# Patient Record
Sex: Female | Born: 1945 | Race: White | Hispanic: No | Smoking: Never smoker
Health system: Southern US, Community
[De-identification: ages and names within clinical notes are randomized; demographics above are authoritative.]

## PROBLEM LIST (undated history)

## (undated) DIAGNOSIS — K589 Irritable bowel syndrome without diarrhea: Secondary | ICD-10-CM

## (undated) DIAGNOSIS — R001 Bradycardia, unspecified: Secondary | ICD-10-CM

## (undated) DIAGNOSIS — I493 Ventricular premature depolarization: Secondary | ICD-10-CM

## (undated) DIAGNOSIS — L900 Lichen sclerosus et atrophicus: Secondary | ICD-10-CM

## (undated) DIAGNOSIS — K449 Diaphragmatic hernia without obstruction or gangrene: Secondary | ICD-10-CM

## (undated) DIAGNOSIS — D649 Anemia, unspecified: Secondary | ICD-10-CM

## (undated) DIAGNOSIS — R51 Headache: Secondary | ICD-10-CM

## (undated) DIAGNOSIS — C50919 Malignant neoplasm of unspecified site of unspecified female breast: Secondary | ICD-10-CM

## (undated) DIAGNOSIS — K859 Acute pancreatitis without necrosis or infection, unspecified: Secondary | ICD-10-CM

## (undated) DIAGNOSIS — F419 Anxiety disorder, unspecified: Secondary | ICD-10-CM

## (undated) DIAGNOSIS — Z5189 Encounter for other specified aftercare: Secondary | ICD-10-CM

## (undated) DIAGNOSIS — I10 Essential (primary) hypertension: Secondary | ICD-10-CM

## (undated) DIAGNOSIS — K219 Gastro-esophageal reflux disease without esophagitis: Secondary | ICD-10-CM

## (undated) DIAGNOSIS — Z973 Presence of spectacles and contact lenses: Secondary | ICD-10-CM

## (undated) DIAGNOSIS — K6289 Other specified diseases of anus and rectum: Secondary | ICD-10-CM

## (undated) DIAGNOSIS — N809 Endometriosis, unspecified: Secondary | ICD-10-CM

## (undated) DIAGNOSIS — K602 Anal fissure, unspecified: Secondary | ICD-10-CM

## (undated) DIAGNOSIS — H269 Unspecified cataract: Secondary | ICD-10-CM

## (undated) DIAGNOSIS — F329 Major depressive disorder, single episode, unspecified: Secondary | ICD-10-CM

## (undated) DIAGNOSIS — R0602 Shortness of breath: Secondary | ICD-10-CM

## (undated) DIAGNOSIS — G8929 Other chronic pain: Secondary | ICD-10-CM

## (undated) DIAGNOSIS — F410 Panic disorder [episodic paroxysmal anxiety] without agoraphobia: Secondary | ICD-10-CM

## (undated) DIAGNOSIS — R519 Headache, unspecified: Secondary | ICD-10-CM

## (undated) DIAGNOSIS — K279 Peptic ulcer, site unspecified, unspecified as acute or chronic, without hemorrhage or perforation: Secondary | ICD-10-CM

## (undated) DIAGNOSIS — F32A Depression, unspecified: Secondary | ICD-10-CM

## (undated) HISTORY — DX: Anxiety disorder, unspecified: F41.9

## (undated) HISTORY — PX: CATARACT EXTRACTION, BILATERAL: SHX1313

## (undated) HISTORY — PX: ABDOMINAL SURGERY: SHX537

## (undated) HISTORY — DX: Diaphragmatic hernia without obstruction or gangrene: K44.9

## (undated) HISTORY — DX: Lichen sclerosus et atrophicus: L90.0

## (undated) HISTORY — DX: Other chronic pain: G89.29

## (undated) HISTORY — DX: Headache, unspecified: R51.9

## (undated) HISTORY — DX: Unspecified cataract: H26.9

## (undated) HISTORY — DX: Anemia, unspecified: D64.9

## (undated) HISTORY — DX: Endometriosis, unspecified: N80.9

## (undated) HISTORY — DX: Encounter for other specified aftercare: Z51.89

## (undated) HISTORY — DX: Ventricular premature depolarization: I49.3

## (undated) HISTORY — PX: TONSILLECTOMY: SUR1361

## (undated) HISTORY — DX: Anal fissure, unspecified: K60.2

## (undated) HISTORY — DX: Headache: R51

## (undated) HISTORY — DX: Malignant neoplasm of unspecified site of unspecified female breast: C50.919

## (undated) HISTORY — DX: Peptic ulcer, site unspecified, unspecified as acute or chronic, without hemorrhage or perforation: K27.9

## (undated) HISTORY — PX: LIPOMA EXCISION: SHX5283

## (undated) HISTORY — DX: Shortness of breath: R06.02

## (undated) HISTORY — DX: Acute pancreatitis without necrosis or infection, unspecified: K85.90

---

## 1990-10-19 HISTORY — PX: TOTAL ABDOMINAL HYSTERECTOMY W/ BILATERAL SALPINGOOPHORECTOMY: SHX83

## 2000-10-19 DIAGNOSIS — Z5189 Encounter for other specified aftercare: Secondary | ICD-10-CM

## 2000-10-19 HISTORY — DX: Encounter for other specified aftercare: Z51.89

## 2000-10-19 HISTORY — PX: LAPAROSCOPIC NISSEN FUNDOPLICATION: SHX1932

## 2000-12-03 ENCOUNTER — Ambulatory Visit (HOSPITAL_COMMUNITY): Admission: RE | Admit: 2000-12-03 | Discharge: 2000-12-03 | Payer: Self-pay | Admitting: Surgery

## 2000-12-03 ENCOUNTER — Encounter: Payer: Self-pay | Admitting: Surgery

## 2001-08-03 ENCOUNTER — Other Ambulatory Visit: Admission: RE | Admit: 2001-08-03 | Discharge: 2001-08-03 | Payer: Self-pay | Admitting: Internal Medicine

## 2005-06-30 ENCOUNTER — Ambulatory Visit: Payer: Self-pay | Admitting: Internal Medicine

## 2005-07-01 ENCOUNTER — Ambulatory Visit: Payer: Self-pay | Admitting: Internal Medicine

## 2011-12-28 ENCOUNTER — Other Ambulatory Visit: Payer: Self-pay

## 2011-12-28 ENCOUNTER — Emergency Department (HOSPITAL_COMMUNITY)
Admission: EM | Admit: 2011-12-28 | Discharge: 2011-12-29 | Disposition: A | Discharge status: Home / Self Care | Payer: Self-pay | Attending: Emergency Medicine | Admitting: Emergency Medicine

## 2011-12-28 ENCOUNTER — Emergency Department (HOSPITAL_COMMUNITY): Payer: Self-pay

## 2011-12-28 ENCOUNTER — Encounter (HOSPITAL_COMMUNITY): Payer: Self-pay | Admitting: *Deleted

## 2011-12-28 DIAGNOSIS — K589 Irritable bowel syndrome without diarrhea: Secondary | ICD-10-CM | POA: Insufficient documentation

## 2011-12-28 DIAGNOSIS — R110 Nausea: Secondary | ICD-10-CM | POA: Insufficient documentation

## 2011-12-28 DIAGNOSIS — K279 Peptic ulcer, site unspecified, unspecified as acute or chronic, without hemorrhage or perforation: Secondary | ICD-10-CM | POA: Insufficient documentation

## 2011-12-28 DIAGNOSIS — R079 Chest pain, unspecified: Secondary | ICD-10-CM | POA: Insufficient documentation

## 2011-12-28 DIAGNOSIS — R109 Unspecified abdominal pain: Secondary | ICD-10-CM | POA: Insufficient documentation

## 2011-12-28 HISTORY — DX: Irritable bowel syndrome, unspecified: K58.9

## 2011-12-28 LAB — LIPASE, BLOOD: Lipase: 22 U/L (ref 11–59)

## 2011-12-28 LAB — COMPREHENSIVE METABOLIC PANEL
Albumin: 4.1 g/dL (ref 3.5–5.2)
BUN: 11 mg/dL (ref 6–23)
Calcium: 10.1 mg/dL (ref 8.4–10.5)
Creatinine, Ser: 1.08 mg/dL (ref 0.50–1.10)
GFR calc Af Amer: 62 mL/min — ABNORMAL LOW (ref >90)
Glucose, Bld: 134 mg/dL — ABNORMAL HIGH (ref 70–99)
Potassium: 3.9 mEq/L (ref 3.5–5.1)
Total Protein: 6.8 g/dL (ref 6.0–8.3)

## 2011-12-28 LAB — DIFFERENTIAL
Basophils Relative: 0 % (ref 0–1)
Eosinophils Absolute: 0.2 10*3/uL (ref 0.0–0.7)
Eosinophils Relative: 3 % (ref 0–5)
Monocytes Absolute: 0.7 10*3/uL (ref 0.1–1.0)
Monocytes Relative: 9 % (ref 3–12)
Neutrophils Relative %: 42 % — ABNORMAL LOW (ref 43–77)

## 2011-12-28 LAB — CBC
Hemoglobin: 13.1 g/dL (ref 12.0–15.0)
MCH: 31 pg (ref 26.0–34.0)
MCHC: 35 g/dL (ref 30.0–36.0)
MCV: 88.6 fL (ref 78.0–100.0)

## 2011-12-28 MED ORDER — PANTOPRAZOLE SODIUM 40 MG IV SOLR
40.0000 mg | Freq: Once | INTRAVENOUS | Status: AC
  Administered 2011-12-28: 40 mg via INTRAVENOUS
  Filled 2011-12-28: qty 40

## 2011-12-28 MED ORDER — ONDANSETRON HCL 4 MG/2ML IJ SOLN
4.0000 mg | Freq: Once | INTRAMUSCULAR | Status: AC
  Administered 2011-12-28: 4 mg via INTRAVENOUS
  Filled 2011-12-28: qty 2

## 2011-12-28 MED ORDER — IOHEXOL 300 MG/ML  SOLN: 20.0000 mL | INTRAMUSCULAR | Status: AC

## 2011-12-28 MED ORDER — GI COCKTAIL ~~LOC~~
30.0000 mL | Freq: Once | ORAL | Status: AC
  Administered 2011-12-28: 30 mL via ORAL
  Filled 2011-12-28: qty 30

## 2011-12-28 MED ORDER — MORPHINE SULFATE 4 MG/ML IJ SOLN
4.0000 mg | Freq: Once | INTRAMUSCULAR | Status: AC
  Administered 2011-12-28: 4 mg via INTRAVENOUS
  Filled 2011-12-28: qty 1

## 2011-12-28 NOTE — ED Notes (Signed)
Care assumed at this time; resting quietly on stretcher with eyes closed - easily arousable to name call; denies nausea - currently rates RUQ abd pain 2/10 - states is "tolerable"; awaiting CT scan - pt aware of same

## 2011-12-28 NOTE — ED Provider Notes (Signed)
History     CSN: [id number]  Arrival date & time 12/28/11  1634   First MD Initiated Contact with Patient 12/28/11 2035      Chief Complaint  Patient presents with  . Chest Pain    HPI  History provided by the patient. Patient is a 66 year old female with history of IBS, PUD who presents with complaints of right-sided lower chest and abdominal pains to increase significantly earlier today around 2 PM. Patient reports having slight off-and-on pains in the same area for the past one to 2 weeks. She has also had associated decreased appetite slight nausea. Today patient reports having sudden increase of pains in the afternoon around 2 PM. Patient was at rest at that time had eaten earlier in the afternoon. Pain continues to wax and wane. At its worst pain was a 9/10. Currently pain is a 3/10. Patient has not taken any medications for her symptoms. Patient does not associate worsening of symptoms with eating. Pain does seem improved with lying flat. Pain is not pleuritic. She denies shortness of breath or hemoptysis. Patient denies any other aggravating or alleviating factors. She has no associated fever, chills, sweats, vomiting, diarrhea or constipation. Patient does report having some constipation last week. She had been taking MiraLAX and reports normal bowel movements for the past 2 days with some slight increase in gas. Patient has no history of recent long travel or prolonged sitting, surgery, history of cancer, previous DVT or PE, clotting disorder, or estrogen use.    Past Medical History  Diagnosis Date  . IBS (irritable bowel syndrome)   . Peptic ulcer disease     Past Surgical History  Procedure Date  . Fundoplasty transthoracic   . Abdominal hysterectomy     History reviewed. No pertinent family history.  History  Substance Use Topics  . Smoking status: Not on file  . Smokeless tobacco: Not on file  . Alcohol Use: No    OB History    Grav Para Term Preterm Abortions  TAB SAB Ect Mult Living                  Review of Systems  Constitutional: Positive for appetite change. Negative for fever and chills.  Respiratory: Negative for cough and shortness of breath.   Cardiovascular: Positive for chest pain.  Gastrointestinal: Positive for nausea, abdominal pain and constipation. Negative for vomiting and diarrhea.  Genitourinary: Negative for dysuria, frequency, hematuria, flank pain, vaginal bleeding and vaginal discharge.  All other systems reviewed and are negative.    Allergies  Vicodin  Home Medications   Current Outpatient Rx  Name Route Sig Dispense Refill  . DIPHENHYDRAMINE HCL 25 MG PO TABS Oral Take 25 mg by mouth every 6 (six) hours as needed.    Marland Kitchen DIPHENHYDRAMINE-APAP (SLEEP) 25-500 MG PO TABS Oral Take 1 tablet by mouth at bedtime as needed.    . TRAZODONE HCL 100 MG PO TABS Oral Take 100 mg by mouth at bedtime.      BP 138/76  Pulse 84  Temp 98.2 F (36.8 C)  SpO2 98%  Physical Exam  Nursing note and vitals reviewed. Constitutional: She is oriented to person, place, and time. She appears well-developed and well-nourished. No distress.  HENT:  Head: Normocephalic and atraumatic.  Cardiovascular: Normal rate and regular rhythm.   Pulmonary/Chest: Effort normal and breath sounds normal. No respiratory distress. She has no wheezes. She has no rales. She exhibits no tenderness.  Abdominal: Soft. She exhibits  no distension. There is tenderness in the right lower quadrant and epigastric area. There is no rigidity, no rebound, no guarding, no CVA tenderness, no tenderness at McBurney's point and negative Murphy's sign.       Surgical scars consistent with history of previous surgeries.  Musculoskeletal: She exhibits no edema and no tenderness.       No swelling or tenderness in lower legs. no signs for DVT.  Neurological: She is alert and oriented to person, place, and time.  Skin: Skin is warm and dry. No rash noted.  Psychiatric:  She has a normal mood and affect. Her behavior is normal.    ED Course  Procedures   Results for orders placed during the hospital encounter of 12/28/11  CBC      Component Value Range   WBC 7.1  4.0 - 10.5 (K/uL)   RBC 4.22  3.87 - 5.11 (MIL/uL)   Hemoglobin 13.1  12.0 - 15.0 (g/dL)   HCT 29.5  62.1 - 30.8 (%)   MCV 88.6  78.0 - 100.0 (fL)   MCH 31.0  26.0 - 34.0 (pg)   MCHC 35.0  30.0 - 36.0 (g/dL)   RDW 65.7  84.6 - 96.2 (%)   Platelets 249  150 - 400 (K/uL)  DIFFERENTIAL      Component Value Range   Neutrophils Relative 42 (*) 43 - 77 (%)   Neutro Abs 3.0  1.7 - 7.7 (K/uL)   Lymphocytes Relative 46  12 - 46 (%)   Lymphs Abs 3.2  0.7 - 4.0 (K/uL)   Monocytes Relative 9  3 - 12 (%)   Monocytes Absolute 0.7  0.1 - 1.0 (K/uL)   Eosinophils Relative 3  0 - 5 (%)   Eosinophils Absolute 0.2  0.0 - 0.7 (K/uL)   Basophils Relative 0  0 - 1 (%)   Basophils Absolute 0.0  0.0 - 0.1 (K/uL)  COMPREHENSIVE METABOLIC PANEL      Component Value Range   Sodium 139  135 - 145 (mEq/L)   Potassium 3.9  3.5 - 5.1 (mEq/L)   Chloride 102  96 - 112 (mEq/L)   CO2 25  19 - 32 (mEq/L)   Glucose, Bld 134 (*) 70 - 99 (mg/dL)   BUN 11  6 - 23 (mg/dL)   Creatinine, Ser 9.52  0.50 - 1.10 (mg/dL)   Calcium 84.1  8.4 - 10.5 (mg/dL)   Total Protein 6.8  6.0 - 8.3 (g/dL)   Albumin 4.1  3.5 - 5.2 (g/dL)   AST 18  0 - 37 (U/L)   ALT 14  0 - 35 (U/L)   Alkaline Phosphatase 46  39 - 117 (U/L)   Total Bilirubin 0.3  0.3 - 1.2 (mg/dL)   GFR calc non Af Amer 53 (*) >90 (mL/min)   GFR calc Af Amer 62 (*) >90 (mL/min)  LIPASE, BLOOD      Component Value Range   Lipase 22  11 - 59 (U/L)  TROPONIN I      Component Value Range   Troponin I <0.30  <0.30 (ng/mL)  TROPONIN I      Component Value Range   Troponin I <0.30  <0.30 (ng/mL)     Dg Chest 2 View  12/28/2011  *RADIOLOGY REPORT*  Clinical Data: Chest pain  CHEST - 2 VIEW  Comparison: None.  Findings: Cardiomediastinal silhouette is unremarkable.   No acute infiltrate or pleural effusion.  No pulmonary edema.  Bony thorax is unremarkable.  IMPRESSION: No active disease.  Original Report Authenticated By: Natasha Mead, M.D.   Ct Abdomen Pelvis W Contrast  12/29/2011  *RADIOLOGY REPORT*  Clinical Data: Right-sided pain  CT ABDOMEN AND PELVIS WITH CONTRAST  Technique:  Multidetector CT imaging of the abdomen and pelvis was performed following the standard protocol during bolus administration of intravenous contrast.  Contrast: OMNIPAQUE IOHEXOL 300 MG/ML IJ SOLN, 40mL OMNIPAQUE IOHEXOL 300 MG/ML IJ SOLN  Comparison: None.  Findings: Limited images through the lung bases demonstrate no significant appreciable abnormality. The heart size is within normal limits. No pleural or pericardial effusion.  Small hiatal hernia.  Surgical changes at the GE junction.  Normal heart size. No pleural or pericardial effusion.  Unremarkable liver, biliary system, spleen, pancreas, adrenal glands.  The duodenal diverticulum.  Too small to further characterize hypodensities within the left kidney.  No hydronephrosis or hydroureter no urinary tract calculi identified.  No bowel obstruction.  No CT evidence for colitis.  Normal appendix.  No free intraperitoneal air or fluid.  Mild scattered atherosclerotic calcification of the aorta and branch vessels.  Thin-walled bladder.  Absent uterus.  No adnexal mass.  No acute osseous abnormality identified.  IMPRESSION: No acute CT abnormality identified.  Normal appendix.  No hydronephrosis.  Original Report Authenticated By: Waneta Martins, M.D.     1. Abdominal pain       MDM  8:30 PM patient seen and evaluated. Patient no acute distress. Patient offered pain medications but she has declined at this time. She states she's feeling much better and would just like to see if there is any serious problem causing her pain.  Patient has no significant risk factors for ACS. No history of hypertension, hyperlipidemia,  diabetes, nonsmoker, family history. Patient with normal EKG and chest x-ray. Patient with no significant concerning history to be concern for PE. Patient with no shortness of breath, hypoxia, tachycardia, pleuritic pains, hemoptysis. Patient has no significant risk factors for DVT, no recent long travel or prolonged sitting, no recent surgery, no past history of cancer, no estrogen use, no clotting disorder.  10:00 PM Patient with normal lab tests today, no elevated WBC, no elevated LFTs and lipase. Patient now reporting increased right upper and epigastric pains. Patient is requesting pain medications. She has significant tenderness in the area on abdominal exam. I discussed with patient further options for evaluation. At this time we'll obtain a CT scan to rule out concerning pathology. We'll also give IV pain medications.  CT scan unremarkable for acute or emergent condition. Patient continues to feel improved. Patient discussed with attending physician. She agrees with plan and workup. At this time she's ready to return home.    Date: 12/28/2011  Rate: 80  Rhythm: normal sinus rhythm  QRS Axis: normal  Intervals: normal  ST/T Wave abnormalities: normal  Conduction Disutrbances:none  Narrative Interpretation:   Old EKG Reviewed: none available          Angus Seller, [place] 12/30/11 [phone]

## 2011-12-28 NOTE — ED Notes (Signed)
To ed for eval of right side rib pain and right arm pain since yesterday. States prior to pain starting she had 'stomach burning'. States if she lays down the pain gets better. Skin w/d, resp e/u. Nauseated.

## 2011-12-29 ENCOUNTER — Emergency Department (HOSPITAL_COMMUNITY): Payer: Self-pay

## 2011-12-29 LAB — TROPONIN I
Troponin I: <0.3 ng/mL (ref <0.30)
Troponin I: <0.3 ng/mL (ref <0.30)

## 2011-12-29 MED ORDER — IOHEXOL 300 MG/ML  SOLN
100.0000 mL | Freq: Once | INTRAMUSCULAR | Status: AC | PRN
  Administered 2011-12-29: 100 mL via INTRAVENOUS

## 2011-12-29 MED ORDER — IOHEXOL 300 MG/ML  SOLN
40.0000 mL | Freq: Once | INTRAMUSCULAR | Status: AC | PRN
  Administered 2011-12-29: 40 mL via ORAL

## 2011-12-29 MED ORDER — TRAMADOL HCL 50 MG PO TABS: 50.0000 mg | ORAL_TABLET | Freq: Four times a day (QID) | ORAL | Status: AC | PRN

## 2011-12-29 NOTE — ED Notes (Signed)
Pt states understanding of discharge instructions 

## 2011-12-29 NOTE — Discharge Instructions (Signed)
You were seen and evaluated today for your complaints of abdominal and right chest pains. Your lab tests, EKG of your heart, chest x-ray, CAT scan of your abdomen did not show any signs for concerning or emergent cause her symptoms. At this time your providers feel you are fine to return home and followup with primary care provider. It is recommended that you have a recheck of your symptoms in the next 24-48 hours. Return to emergency room if any worsening pain, fever, chills, sweats, persistent nausea vomiting.   Abdominal Pain Abdominal pain can be caused by many things. Your caregiver decides the seriousness of your pain by an examination and possibly blood tests and X-rays. Many cases can be observed and treated at home. Most abdominal pain is not caused by a disease and will probably improve without treatment. However, in many cases, more time must pass before a clear cause of the pain can be found. Before that point, it may not be known if you need more testing, or if hospitalization or surgery is needed. HOME CARE INSTRUCTIONS   Do not take laxatives unless directed by your caregiver.   Take pain medicine only as directed by your caregiver.   Only take over-the-counter or prescription medicines for pain, discomfort, or fever as directed by your caregiver.   Try a clear liquid diet (broth, tea, or water) for as long as directed by your caregiver. Slowly move to a bland diet as tolerated.  SEEK IMMEDIATE MEDICAL CARE IF:   The pain does not go away.   You have a fever.   You keep throwing up (vomiting).   The pain is felt only in portions of the abdomen. Pain in the right side could possibly be appendicitis. In an adult, pain in the left lower portion of the abdomen could be colitis or diverticulitis.   You pass bloody or black tarry stools.  MAKE SURE YOU:   Understand these instructions.   Will watch your condition.   Will get help right away if you are not doing well or get  worse.  Document Released: 07/15/2005 Document Revised: 09/24/2011 Document Reviewed: 05/23/2008 [facility] Patient Information 2012 [place], [place].   RESOURCE GUIDE  Dental Problems  Patients with Medicaid: [facility] [phone]                                           [phone] [address] Phone:  [phone]                                                  Phone:  [phone]  If unable to pay or uninsured, contact:  [facility] or [facility] to become qualified for the adult dental clinic.  Chronic Pain Problems Contact [facility]  [phone] Patients need to be referred by their primary care doctor.  Insufficient Money for Medicine Contact [employer] Way:  call "211" or [facility] [phone].  No Primary Care Doctor Call [facility]  [phone] Other agencies that provide inexpensive medical care    [facility]  [phone]    [facility]  [phone]    [facility]  [phone]    [facility]  [phone]    [facility]  [phone]    [facility]  [phone]  Psychological Services [facility] Behavioral [facility]  [phone] [facility]  [phone] [facility]   [phone] (emergency services [phone])  Substance Abuse Resources [facility]  [phone] [facility] [phone] [place] [phone] Floydene Flock [phone] Residential & Outpatient Substance Abuse Program  [phone]  Abuse/Neglect [facility] Child Abuse Hotline (207)867-2061 [facility] Child Abuse Hotline 704-729-6591 (After Hours)  Emergency Shelter [facility] [phone]  Maternity Homes [facility] [place] [facility] [place] [phone] [facility] [phone]  MRSA Hotline #:   (737)645-2392    [facility] Resources  [facility]  [place]     United [employer]                          [facility] Dept. [address] [place]                       [address]      [address]  Blondell Reveal Phone:  [phone]                                   Phone:  [phone]                 Phone:  [phone]  [facility] Mental [facility] Phone:  [phone]  [facility] Child Abuse Hotline [phone] [phone] (After Hours)

## 2011-12-29 NOTE — ED Notes (Signed)
Kim, in CT scan, notified of pt's completion of PO contrast

## 2011-12-29 NOTE — ED Notes (Signed)
Patient transported to CT 

## 2011-12-31 NOTE — ED Provider Notes (Signed)
Medical screening examination/treatment/procedure(s) were performed by non-physician practitioner and as supervising physician I was immediately available for consultation/collaboration.  Gerhard Munch, MD 12/31/11 [phone]

## 2012-01-08 ENCOUNTER — Other Ambulatory Visit: Payer: Self-pay | Admitting: Internal Medicine

## 2012-01-11 ENCOUNTER — Ambulatory Visit
Admission: RE | Admit: 2012-01-11 | Discharge: 2012-01-11 | Disposition: A | Discharge status: Home / Self Care | Payer: PRIVATE HEALTH INSURANCE | Source: Ambulatory Visit | Attending: Internal Medicine | Admitting: Internal Medicine

## 2012-05-23 ENCOUNTER — Emergency Department (HOSPITAL_BASED_OUTPATIENT_CLINIC_OR_DEPARTMENT_OTHER)
Admission: EM | Admit: 2012-05-23 | Discharge: 2012-05-23 | Disposition: A | Discharge status: Home / Self Care | Payer: Self-pay | Attending: Emergency Medicine | Admitting: Emergency Medicine

## 2012-05-23 ENCOUNTER — Encounter (HOSPITAL_BASED_OUTPATIENT_CLINIC_OR_DEPARTMENT_OTHER): Payer: Self-pay | Admitting: *Deleted

## 2012-05-23 DIAGNOSIS — F3289 Other specified depressive episodes: Secondary | ICD-10-CM | POA: Insufficient documentation

## 2012-05-23 DIAGNOSIS — F329 Major depressive disorder, single episode, unspecified: Secondary | ICD-10-CM | POA: Insufficient documentation

## 2012-05-23 DIAGNOSIS — F419 Anxiety disorder, unspecified: Secondary | ICD-10-CM

## 2012-05-23 DIAGNOSIS — F411 Generalized anxiety disorder: Secondary | ICD-10-CM | POA: Insufficient documentation

## 2012-05-23 DIAGNOSIS — Z79899 Other long term (current) drug therapy: Secondary | ICD-10-CM | POA: Insufficient documentation

## 2012-05-23 HISTORY — DX: Depression, unspecified: F32.A

## 2012-05-23 HISTORY — DX: Major depressive disorder, single episode, unspecified: F32.9

## 2012-05-23 HISTORY — DX: Essential (primary) hypertension: I10

## 2012-05-23 HISTORY — DX: Panic disorder (episodic paroxysmal anxiety): F41.0

## 2012-05-23 MED ORDER — ALPRAZOLAM 0.5 MG PO TABS: 0.5000 mg | ORAL_TABLET | Freq: Three times a day (TID) | ORAL | Status: AC | PRN

## 2012-05-23 NOTE — ED Notes (Signed)
Patient was crying in triage, she stated she attempted suicide four years ago, she states had relatives who just moved in with her, and has stress with panic attacks.

## 2012-05-23 NOTE — ED Notes (Signed)
Patient is very emotional and crying.  States she feels like she is having panic attacks for the last 2 weeks.  Past history of depression and anxiety.  States she was on trazedom at night and has been out of for one month.  States she lives with her sister due to financial reasons and recently her niece and 3 children moved in with them into a 2 bedroom apartment.  States she feels like all the activities at home are contributing to her panic attacks.  Denies suicidal intentions, however she stated she would like to die to stop all of her emotional pain.

## 2012-05-23 NOTE — ED Provider Notes (Signed)
History     CSN: [id number]  Arrival date & time 05/23/12  1038   First MD Initiated Contact with Patient 05/23/12 1155      Chief Complaint  Patient presents with  . Panic Attack    (Consider location/radiation/quality/duration/timing/severity/associated sxs/prior treatment) HPI This generally well female presents with new concerns of increasingly frequent panic attacks.  She has a long history of depression, anxiety, chronic previous benzodiazepine use, as well as antipsychotic use.  She notes that until 2 weeks ago she was generally well.  Since that time she's had increasingly frequent, incapacitating episodes of anxiety.  She notes multiple life stress source at this time. She denies any physical complaints.  She states that her symptoms are debilitating, ease only after prolonged periods of rest.  She requests a short course of benzodiazepines She has no primary care physician. Past Medical History  Diagnosis Date  . IBS (irritable bowel syndrome)   . Peptic ulcer disease   . Hypertension   . Panic attacks   . Depression     Past Surgical History  Procedure Date  . Fundoplasty transthoracic   . Abdominal hysterectomy   . Tonsillectomy     No family history on file.  History  Substance Use Topics  . Smoking status: Never Smoker   . Smokeless tobacco: Not on file  . Alcohol Use: No    OB History    Grav Para Term Preterm Abortions TAB SAB Ect Mult Living                  Review of Systems  Constitutional:       HPI  HENT:       HPI otherwise negative  Eyes: Negative.   Respiratory:       HPI, otherwise negative  Cardiovascular:       HPI, otherwise nmegative  Gastrointestinal: Negative for vomiting.  Genitourinary:       HPI, otherwise negative  Musculoskeletal:       HPI, otherwise negative  Skin: Negative.   Neurological: Negative for syncope.  Psychiatric/Behavioral: Positive for disturbed wake/sleep cycle. Negative for suicidal ideas,  hallucinations, confusion, self-injury and dysphoric mood. The patient is nervous/anxious. The patient is not hyperactive.     Allergies  Vicodin  Home Medications   Current Outpatient Rx  Name Route Sig Dispense Refill  . ALPRAZOLAM 0.5 MG PO TABS Oral Take 1 tablet (0.5 mg total) by mouth 3 (three) times daily as needed for anxiety. 30 tablet 0  . DIPHENHYDRAMINE HCL 25 MG PO TABS Oral Take 25 mg by mouth every 6 (six) hours as needed.    Marland Kitchen DIPHENHYDRAMINE-APAP (SLEEP) 25-500 MG PO TABS Oral Take 1 tablet by mouth at bedtime as needed.      BP 141/89  Pulse 78  Temp 98.8 F (37.1 C) (Oral)  Resp 21  Ht 5\' 4"  (1.626 m)  Wt 135 lb (61.236 kg)  BMI 23.17 kg/m2  SpO2 100%  Physical Exam  Nursing note and vitals reviewed. Constitutional: She is oriented to person, place, and time. She appears well-developed and well-nourished. No distress.  HENT:  Head: Normocephalic and atraumatic.  Eyes: Conjunctivae and EOM are normal.  Cardiovascular: Normal rate and regular rhythm.   Pulmonary/Chest: Effort normal and breath sounds normal. No stridor. No respiratory distress.  Abdominal: She exhibits no distension.  Musculoskeletal: She exhibits no edema.  Neurological: She is alert and oriented to person, place, and time. No cranial nerve deficit.  Skin: Skin  is warm and dry.  Psychiatric: Her speech is normal and behavior is normal. Judgment normal. Her mood appears anxious. Thought content is not paranoid and not delusional. Cognition and memory are normal. She expresses no homicidal and no suicidal ideation. She expresses no suicidal plans and no homicidal plans.    ED Course  Procedures (including critical care time)  Labs Reviewed - No data to display No results found.   1. Anxiety       MDM  This well-appearing female with a long history of anxiety and depression now presents with concerns of increasingly frequent panic attack this period the patient has no physical  complaints and on exam is unremarkable.  She denies any suicidal ideation, other overtly dangerous psychiatric issues.  She was provided a short course of benzodiazepines, given referral information to a clinic, discharged in stable condition.    Gerhard Munch, MD 05/23/12 1227

## 2012-08-29 ENCOUNTER — Encounter (HOSPITAL_BASED_OUTPATIENT_CLINIC_OR_DEPARTMENT_OTHER): Payer: Self-pay | Admitting: Family Medicine

## 2012-08-29 ENCOUNTER — Emergency Department (HOSPITAL_BASED_OUTPATIENT_CLINIC_OR_DEPARTMENT_OTHER)
Admission: EM | Admit: 2012-08-29 | Discharge: 2012-08-29 | Disposition: A | Discharge status: Home / Self Care | Payer: Self-pay | Attending: Emergency Medicine | Admitting: Emergency Medicine

## 2012-08-29 DIAGNOSIS — I10 Essential (primary) hypertension: Secondary | ICD-10-CM | POA: Insufficient documentation

## 2012-08-29 DIAGNOSIS — K589 Irritable bowel syndrome without diarrhea: Secondary | ICD-10-CM | POA: Insufficient documentation

## 2012-08-29 DIAGNOSIS — F411 Generalized anxiety disorder: Secondary | ICD-10-CM | POA: Insufficient documentation

## 2012-08-29 DIAGNOSIS — F3289 Other specified depressive episodes: Secondary | ICD-10-CM | POA: Insufficient documentation

## 2012-08-29 DIAGNOSIS — F329 Major depressive disorder, single episode, unspecified: Secondary | ICD-10-CM | POA: Insufficient documentation

## 2012-08-29 DIAGNOSIS — F419 Anxiety disorder, unspecified: Secondary | ICD-10-CM

## 2012-08-29 MED ORDER — ALPRAZOLAM 0.5 MG PO TABS: 0.5000 mg | ORAL_TABLET | Freq: Every evening | ORAL | Status: DC | PRN

## 2012-08-29 NOTE — ED Notes (Signed)
Pt reports situational anxiety which had previously been helped by short-term use of Xanax. Pt has numerous stressors including financial. Denies any SI and HI. Reports support from church. Reports not sleeping well due to anxieties.

## 2012-08-29 NOTE — ED Notes (Signed)
Pt c/o anxiety attack x 2 days. Pt sts she does not take meds for anxiety. Pt sts she needs help because she has a h/o of attempting suicide 5 years ago. Pt reports increased stress recently and is having trouble coping. Pt drove self to ED.

## 2012-08-29 NOTE — ED Provider Notes (Signed)
History  This chart was scribed for Rolan Bucco, MD by Ardeen Jourdain, ED Scribe. This patient was seen in room MH01/MH01 and the patient's care was started at 1836.  CSN: [id number]  Arrival date & time 08/29/12  1645   First MD Initiated Contact with Patient 08/29/12 1836      Chief Complaint  Patient presents with  . Anxiety     The history is provided by the patient. No language interpreter was used.    LASHARN BUFKIN is a 66 y.o. female who presents to the Emergency Department complaining of a gradually worsening panic attack with associated anxiety and depression. She denies any physical symptoms and is not seeing a Veterinary surgeon. She states that she has a h/o of these symptoms and doesn't want them to get worse. She reports having a troubled economic situation which has been aggravating the anxiety. She denies seeing a counselor or taking any medication. She has a h/o depression, SI and panic attacks. She denies smoking and alcohol use.  She adamantly denies any SI/HI, hallucinations.  Says that she does not feel that her symptoms are to the point where she need to see a therapist tonight or seek hospitalization.  She requests anxiety meds.    Past Medical History  Diagnosis Date  . IBS (irritable bowel syndrome)   . Peptic ulcer disease   . Hypertension   . Panic attacks   . Depression     Past Surgical History  Procedure Date  . Fundoplasty transthoracic   . Abdominal hysterectomy   . Tonsillectomy     No family history on file.  History  Substance Use Topics  . Smoking status: Never Smoker   . Smokeless tobacco: Not on file  . Alcohol Use: No   No OB history available.   Review of Systems  Constitutional: Negative for fever, chills, diaphoresis and fatigue.  HENT: Negative for congestion, rhinorrhea and sneezing.   Eyes: Negative.   Respiratory: Negative for cough, chest tightness and shortness of breath.   Cardiovascular: Negative for chest pain and leg  swelling.  Gastrointestinal: Negative for nausea, vomiting, abdominal pain, diarrhea and blood in stool.  Genitourinary: Negative for frequency, hematuria, flank pain and difficulty urinating.  Musculoskeletal: Negative for back pain and arthralgias.  Skin: Negative for rash.  Neurological: Negative for dizziness, speech difficulty, weakness, numbness and headaches.  Psychiatric/Behavioral: Negative for hallucinations. The patient is nervous/anxious.   All other systems reviewed and are negative.    Allergies  Vicodin  Home Medications   Current Outpatient Rx  Name  Route  Sig  Dispense  Refill  . ALPRAZOLAM 0.5 MG PO TABS   Oral   Take 1 tablet (0.5 mg total) by mouth at bedtime as needed for sleep.   20 tablet   0   . DIPHENHYDRAMINE HCL 25 MG PO TABS   Oral   Take 25 mg by mouth every 6 (six) hours as needed.         Marland Kitchen DIPHENHYDRAMINE-APAP (SLEEP) 25-500 MG PO TABS   Oral   Take 1 tablet by mouth at bedtime as needed.           Triage Vitals: BP 157/90  Pulse 100  Temp 98.4 F (36.9 C) (Oral)  Resp 18  Ht 5\' 4"  (1.626 m)  Wt 140 lb (63.504 kg)  BMI 24.03 kg/m2  SpO2 96%  Physical Exam  Nursing note and vitals reviewed. Constitutional: She is oriented to person, place, and time. She  appears well-developed and well-nourished.  HENT:  Head: Normocephalic and atraumatic.  Eyes: Pupils are equal, round, and reactive to light.  Neck: Normal range of motion. Neck supple.  Cardiovascular: Normal rate, regular rhythm and normal heart sounds.   Pulmonary/Chest: Effort normal and breath sounds normal. No respiratory distress. She has no wheezes. She has no rales. She exhibits no tenderness.  Abdominal: Soft. Bowel sounds are normal. There is no tenderness. There is no rebound and no guarding.  Musculoskeletal: Normal range of motion. She exhibits no edema.  Lymphadenopathy:    She has no cervical adenopathy.  Neurological: She is alert and oriented to person,  place, and time.  Skin: Skin is warm and dry. No rash noted.  Psychiatric: She has a normal mood and affect.    ED Course  Procedures (including critical care time)  DIAGNOSTIC STUDIES: Oxygen Saturation is 96% on room air, normal by my interpretation.    COORDINATION OF CARE:  6:56 PM: Discussed treatment plan which includes anxiety medications with pt at bedside and pt agreed to plan.    Labs Reviewed - No data to display No results found.   1. Anxiety       MDM  Pt with anxiety, not suicidal.  Does not appear to be a threat to herself or others.  Stressed importance of f/lu with behavioral health, given resource guide.  Will give rx for xanax.  Advised f/u with behavioral health or return if symptoms worsen      I personally performed the services described in this documentation, which was scribed in my presence.  The recorded information has been reviewed and considered.    Rolan Bucco, MD 08/29/12 1905

## 2013-12-02 DIAGNOSIS — F4329 Adjustment disorder with other symptoms: Secondary | ICD-10-CM | POA: Insufficient documentation

## 2013-12-02 DIAGNOSIS — E785 Hyperlipidemia, unspecified: Secondary | ICD-10-CM | POA: Insufficient documentation

## 2014-01-22 DIAGNOSIS — G4700 Insomnia, unspecified: Secondary | ICD-10-CM | POA: Insufficient documentation

## 2015-05-30 DIAGNOSIS — F410 Panic disorder [episodic paroxysmal anxiety] without agoraphobia: Secondary | ICD-10-CM | POA: Insufficient documentation

## 2015-11-18 ENCOUNTER — Other Ambulatory Visit: Payer: Self-pay | Admitting: General Surgery

## 2015-12-03 ENCOUNTER — Encounter (HOSPITAL_BASED_OUTPATIENT_CLINIC_OR_DEPARTMENT_OTHER): Payer: Self-pay | Admitting: *Deleted

## 2015-12-03 NOTE — Progress Notes (Signed)
NPO AFTER MN WITH EXCEPTION CLEAR LIQUIDS UNTIL 0730 (NO CREAM [place] PRODUCTS).  ARRIVE AT 1200.  NEEDS HG.  WILL TAKE PRISTIQ AND PRILOSEC AM DOS W/ SIPS OF WATER.

## 2015-12-06 ENCOUNTER — Ambulatory Visit (HOSPITAL_BASED_OUTPATIENT_CLINIC_OR_DEPARTMENT_OTHER): Payer: 59 | Admitting: Anesthesiology

## 2015-12-06 ENCOUNTER — Ambulatory Visit (HOSPITAL_BASED_OUTPATIENT_CLINIC_OR_DEPARTMENT_OTHER)
Admission: RE | Admit: 2015-12-06 | Discharge: 2015-12-06 | Disposition: A | Discharge status: Home / Self Care | Payer: 59 | Source: Ambulatory Visit | Attending: General Surgery | Admitting: General Surgery

## 2015-12-06 ENCOUNTER — Encounter (HOSPITAL_BASED_OUTPATIENT_CLINIC_OR_DEPARTMENT_OTHER)
Admission: RE | Disposition: A | Discharge status: Home / Self Care | Payer: Self-pay | Source: Ambulatory Visit | Attending: General Surgery

## 2015-12-06 ENCOUNTER — Encounter (HOSPITAL_BASED_OUTPATIENT_CLINIC_OR_DEPARTMENT_OTHER): Payer: Self-pay | Admitting: *Deleted

## 2015-12-06 DIAGNOSIS — F419 Anxiety disorder, unspecified: Secondary | ICD-10-CM | POA: Insufficient documentation

## 2015-12-06 DIAGNOSIS — F329 Major depressive disorder, single episode, unspecified: Secondary | ICD-10-CM | POA: Diagnosis not present

## 2015-12-06 DIAGNOSIS — K219 Gastro-esophageal reflux disease without esophagitis: Secondary | ICD-10-CM | POA: Diagnosis not present

## 2015-12-06 DIAGNOSIS — R21 Rash and other nonspecific skin eruption: Secondary | ICD-10-CM | POA: Diagnosis not present

## 2015-12-06 DIAGNOSIS — M1990 Unspecified osteoarthritis, unspecified site: Secondary | ICD-10-CM | POA: Diagnosis not present

## 2015-12-06 DIAGNOSIS — Z79899 Other long term (current) drug therapy: Secondary | ICD-10-CM | POA: Insufficient documentation

## 2015-12-06 DIAGNOSIS — K449 Diaphragmatic hernia without obstruction or gangrene: Secondary | ICD-10-CM | POA: Insufficient documentation

## 2015-12-06 DIAGNOSIS — K6289 Other specified diseases of anus and rectum: Secondary | ICD-10-CM | POA: Diagnosis present

## 2015-12-06 DIAGNOSIS — K5909 Other constipation: Secondary | ICD-10-CM | POA: Insufficient documentation

## 2015-12-06 DIAGNOSIS — L280 Lichen simplex chronicus: Secondary | ICD-10-CM | POA: Diagnosis not present

## 2015-12-06 HISTORY — DX: Other specified diseases of anus and rectum: K62.89

## 2015-12-06 HISTORY — PX: RECTAL EXAM UNDER ANESTHESIA: SHX6399

## 2015-12-06 HISTORY — DX: Gastro-esophageal reflux disease without esophagitis: K21.9

## 2015-12-06 HISTORY — DX: Presence of spectacles and contact lenses: Z97.3

## 2015-12-06 LAB — POCT HEMOGLOBIN-HEMACUE: Hemoglobin: 13.4 g/dL (ref 12.0–15.0)

## 2015-12-06 SURGERY — EXAM UNDER ANESTHESIA, RECTUM
Anesthesia: Monitor Anesthesia Care | Site: Anus

## 2015-12-06 MED ORDER — LIDOCAINE 5 % EX OINT
TOPICAL_OINTMENT | CUTANEOUS | Status: DC | PRN
  Administered 2015-12-06: 1

## 2015-12-06 MED ORDER — MIDAZOLAM HCL 5 MG/5ML IJ SOLN
INTRAMUSCULAR | Status: DC | PRN
  Administered 2015-12-06: 2 mg via INTRAVENOUS

## 2015-12-06 MED ORDER — BUPIVACAINE LIPOSOME 1.3 % IJ SUSP
INTRAMUSCULAR | Status: DC | PRN
  Administered 2015-12-06: 20 mL

## 2015-12-06 MED ORDER — OXYCODONE HCL 5 MG PO TABS: 5.0000 mg | ORAL_TABLET | ORAL | Status: DC | PRN

## 2015-12-06 MED ORDER — FENTANYL CITRATE (PF) 100 MCG/2ML IJ SOLN
INTRAMUSCULAR | Status: DC | PRN
  Administered 2015-12-06 (×2): 50 ug via INTRAVENOUS

## 2015-12-06 MED ORDER — BUPIVACAINE-EPINEPHRINE 0.5% -1:200000 IJ SOLN
INTRAMUSCULAR | Status: DC | PRN
  Administered 2015-12-06: 23 mL

## 2015-12-06 MED ORDER — PROPOFOL 500 MG/50ML IV EMUL
INTRAVENOUS | Status: DC | PRN
  Administered 2015-12-06: 200 ug/kg/min via INTRAVENOUS

## 2015-12-06 MED ORDER — PROPOFOL 10 MG/ML IV BOLUS
INTRAVENOUS | Status: AC
  Filled 2015-12-06: qty 40

## 2015-12-06 MED ORDER — MIDAZOLAM HCL 2 MG/2ML IJ SOLN
INTRAMUSCULAR | Status: AC
  Filled 2015-12-06: qty 2

## 2015-12-06 MED ORDER — FENTANYL CITRATE (PF) 100 MCG/2ML IJ SOLN
INTRAMUSCULAR | Status: AC
  Filled 2015-12-06: qty 2

## 2015-12-06 MED ORDER — LACTATED RINGERS IV SOLN
INTRAVENOUS | Status: DC
  Administered 2015-12-06: 13:00:00 via INTRAVENOUS
  Filled 2015-12-06: qty 1000

## 2015-12-06 SURGICAL SUPPLY — 39 items
BENZOIN TINCTURE PRP APPL 2/3 (GAUZE/BANDAGES/DRESSINGS) ×3 IMPLANT
BLADE SURG 15 STRL LF DISP TIS (BLADE) ×1 IMPLANT
BLADE SURG 15 STRL SS (BLADE) ×2
BRIEF STRETCH FOR OB PAD LRG (UNDERPADS AND DIAPERS) ×6 IMPLANT
COVER BACK TABLE 60X90IN (DRAPES) ×3 IMPLANT
COVER MAYO STAND STRL (DRAPES) ×3 IMPLANT
DECANTER SPIKE VIAL GLASS SM (MISCELLANEOUS) ×3 IMPLANT
DRAPE LAPAROTOMY 100X72 PEDS (DRAPES) ×3 IMPLANT
DRAPE UTILITY XL STRL (DRAPES) ×3 IMPLANT
DRSG PAD ABDOMINAL 8X10 ST (GAUZE/BANDAGES/DRESSINGS) ×3 IMPLANT
GAUZE SPONGE 4X4 16PLY XRAY LF (GAUZE/BANDAGES/DRESSINGS) IMPLANT
GLOVE BIO SURGEON STRL SZ 6.5 (GLOVE) ×4 IMPLANT
GLOVE BIO SURGEONS STRL SZ 6.5 (GLOVE) ×2
GLOVE INDICATOR 7.0 STRL GRN (GLOVE) ×6 IMPLANT
GOWN STRL REUS W/ TWL LRG LVL3 (GOWN DISPOSABLE) ×1 IMPLANT
GOWN STRL REUS W/ TWL XL LVL3 (GOWN DISPOSABLE) ×2 IMPLANT
GOWN STRL REUS W/TWL LRG LVL3 (GOWN DISPOSABLE) ×2
GOWN STRL REUS W/TWL XL LVL3 (GOWN DISPOSABLE) ×4
KIT ROOM TURNOVER WOR (KITS) ×3 IMPLANT
NDL SAFETY ECLIPSE 18X1.5 (NEEDLE) IMPLANT
NEEDLE HYPO 18GX1.5 SHARP (NEEDLE)
NEEDLE HYPO 25X1 1.5 SAFETY (NEEDLE) IMPLANT
NS IRRIG 500ML POUR BTL (IV SOLUTION) ×3 IMPLANT
PACK BASIN DAY SURGERY FS (CUSTOM PROCEDURE TRAY) ×3 IMPLANT
PAD ARMBOARD 7.5X6 YLW CONV (MISCELLANEOUS) ×3 IMPLANT
SPONGE GAUZE 4X4 12PLY (GAUZE/BANDAGES/DRESSINGS) IMPLANT
SPONGE GAUZE 4X4 12PLY STER LF (GAUZE/BANDAGES/DRESSINGS) ×3 IMPLANT
SPONGE SURGIFOAM ABS GEL 12-7 (HEMOSTASIS) IMPLANT
SUT CHROMIC 2 0 SH (SUTURE) ×3 IMPLANT
SUT ETHIBOND 0 (SUTURE) IMPLANT
SUT GUT CHROMIC 3 0 (SUTURE) IMPLANT
SUT VIC AB 4-0 P-3 18XBRD (SUTURE) IMPLANT
SUT VIC AB 4-0 P3 18 (SUTURE)
SYR CONTROL 10ML LL (SYRINGE) ×3 IMPLANT
TOWEL OR 17X24 6PK STRL BLUE (TOWEL DISPOSABLE) ×3 IMPLANT
TRAY DSU PREP LF (CUSTOM PROCEDURE TRAY) ×3 IMPLANT
TUBE CONNECTING 12'X1/4 (SUCTIONS) ×1
TUBE CONNECTING 12X1/4 (SUCTIONS) ×2 IMPLANT
YANKAUER SUCT BULB TIP NO VENT (SUCTIONS) ×3 IMPLANT

## 2015-12-06 NOTE — H&P (Signed)
The patient is a 70 year old female who presents with anal pain. 70 year old female who presents to the office with chronic constipation and anal pain. She states that this has been worse over the past couple months. She has a history of constipation since she was very young. She uses a fiber supplement on a daily basis. She drinks plenty of water throughout the day. She denies any bleeding. Her last colonoscopy was approximately 1 year ago in [place] and 1 polyp was found. Despite the fiber and water intake she continues to have irregular bowel habits.   Other Problems Mammie Lorenzo, LPN; 579FGE [id number] PM) Anxiety Disorder Arthritis Depression Gastroesophageal Reflux Disease Other disease, cancer, significant illness  Past Surgical History Mammie Lorenzo, LPN; 579FGE X33443 PM) Colon Polyp Removal - Colonoscopy Hysterectomy (not due to cancer) - Complete Nissen Fundoplication Tonsillectomy  Diagnostic Studies History Mammie Lorenzo, LPN; 579FGE X33443 PM) Colonoscopy 1-5 years ago Mammogram 1-3 years ago Pap Smear >5 years ago  Allergies Mammie Lorenzo, LPN; 579FGE QA348G PM) Hydrocodone-Acetaminophen *ANALGESICS - OPIOID* Itching.  Medication History Mammie Lorenzo, LPN; 579FGE QA348G PM) ALPRAZolam (1MG  Tablet, Oral) Active. Pristiq (100MG  Tablet ER 24HR, Oral) Active. Ambien (10MG  Tablet, Oral) Active. Medications Reconciled  Social History Mammie Lorenzo, LPN; 579FGE X33443 PM) Alcohol use Occasional alcohol use. Caffeine use Tea. No drug use Tobacco use Never smoker.  Family History Mammie Lorenzo, LPN; 579FGE X33443 PM) Alcohol Abuse Brother. Arthritis Brother, Father, Mother. Cancer Father, Mother. Depression Mother, Sister. Hypertension Mother. Ischemic Bowel Disease Mother. Migraine Headache Father, Sister. Respiratory Condition Father. Thyroid problems Mother.  Pregnancy / Birth History Mammie Lorenzo, LPN;  579FGE X33443 PM) Age at menarche 50 years. Age of menopause <47 Gravida 0 Para 0    Review of Systems Claiborne Billings Dockery LPN; 579FGE X33443 PM) General Not Present- Appetite Loss, Chills, Fatigue, Fever, Night Sweats, Weight Gain and Weight Loss. Skin Not Present- Change in Wart/Mole, Dryness, Hives, Jaundice, New Lesions, Non-Healing Wounds, Rash and Ulcer. HEENT Present- Hearing Loss, Seasonal Allergies and Wears glasses/contact lenses. Not Present- Earache, Hoarseness, Nose Bleed, Oral Ulcers, Ringing in the Ears, Sinus Pain, Sore Throat, Visual Disturbances and Yellow Eyes. Respiratory Not Present- Bloody sputum, Chronic Cough, Difficulty Breathing, Snoring and Wheezing. Breast Not Present- Breast Mass, Breast Pain, Nipple Discharge and Skin Changes. Cardiovascular Present- Leg Cramps, Palpitations and Shortness of Breath. Not Present- Chest Pain, Difficulty Breathing Lying Down, Rapid Heart Rate and Swelling of Extremities. Gastrointestinal Present- Chronic diarrhea, Constipation, Excessive gas and Rectal Pain. Not Present- Abdominal Pain, Bloating, Bloody Stool, Change in Bowel Habits, Difficulty Swallowing, Gets full quickly at meals, Hemorrhoids, Indigestion, Nausea and Vomiting. Female Genitourinary Not Present- Frequency, Nocturia, Painful Urination, Pelvic Pain and Urgency. Musculoskeletal Present- Muscle Weakness. Not Present- Back Pain, Joint Pain, Joint Stiffness, Muscle Pain and Swelling of Extremities. Neurological Present- Decreased Memory, Headaches and Weakness. Not Present- Fainting, Numbness, Seizures, Tingling, Tremor and Trouble walking. Psychiatric Present- Anxiety, Depression and Frequent crying. Not Present- Bipolar, Change in Sleep Pattern and Fearful. Endocrine Present- Cold Intolerance and Heat Intolerance. Not Present- Excessive Hunger, Hair Changes, Hot flashes and New Diabetes.  Vitals Claiborne Billings Dockery LPN; 579FGE QA348G PM) 11/18/2015 1:24 PM Weight: 127.2  lb Height: 64in Body Surface Area: 1.61 m Body Mass Index: 21.83 kg/m  Temp.: 98.73F(Oral)  Pulse: 71 (Regular)  BP: 118/74 (Sitting, Left Arm, Standard)       Physical Exam Leighton Ruff MD; [id number] 1:39 PM) General Mental Status-Alert. General Appearance-Not in acute distress. Build & Nutrition-Well nourished.  Posture-Normal posture. Gait-Normal.  Head and Neck Head-normocephalic, atraumatic with no lesions or palpable masses. Trachea-midline.  Chest and Lung Exam Chest and lung exam reveals -on auscultation, normal breath sounds, no adventitious sounds and normal vocal resonance.  Cardiovascular Cardiovascular examination reveals -normal heart sounds, regular rate and rhythm with no murmurs.  Abdomen Inspection Inspection of the abdomen reveals - No Hernias. Palpation/Percussion Palpation and Percussion of the abdomen reveal - Soft, Non Tender, No Rigidity (guarding), No hepatosplenomegaly and No Palpable abdominal masses.  Rectal Anorectal Exam External - Note: rash with Mercier plaques and fissuring of anal canal. Internal - Note: unable to examin due to pain.  Neurologic Neurologic evaluation reveals -alert and oriented x 3 with no impairment of recent or remote memory, normal attention span and ability to concentrate, normal sensation and normal coordination.  Musculoskeletal Normal Exam - Bilateral-Upper Extremity Strength Normal and Lower Extremity Strength Normal.    Assessment & Plan Leighton Ruff MD; [id number] 1:42 PM) ANAL PAIN (K62.89) Impression: 70 year old female with chronic constipation who presents to the office with chronically irritated anal canal. I'm unable to do a complete exam in the office and recommended an exam under anesthesia with possible biopsy. In the meantime she will try to double her MiraLAX dose and see if this helps with her bowel movements.

## 2015-12-06 NOTE — Discharge Instructions (Addendum)
Post Operative Instructions Following Surgery   First 24 h  Remove the dressing this evening after you return home from the hospital  Sit in a tub or sitz bath of COOL water for 20 minutes every hour while awake.  After the bath, carefully blot the area dry and place a piece of 100% Cotton with Lidocaine ointment on it next to the anal opening.  You may sit on a covered ice pack between the baths as needed.    You should have crushed ice in small amounts until you are able to urinate.  After you urinate, you may drink fluids.  It is normal to not urinate for the first few hours after surgery.  If you become uncomfortable, call the office for instructions.   Take your pain medications as prescribed  You may eat whatever you feel like.  Start with a light meal and gradually advance your diet.    Beginning the day after your surgery  Sit in a tub of cool to warm water for 15 minutes at least 3 times a day and after bowel movements  After the bowel movement, clean with wet cotton, Tucks or baby wipes.  Apply Silvadene to a thin piece of cotton and place over the anal opening after your baths.  This will collect any drainage or bleeding.  Drainage is usually a pink to tan color and is normal for the following 2-3 weeks after surgery.  Change to cotton ever 2-3 hours while awake.  Diet  Eat a regular diet.  Avoid foods that may constipate you or give you diarrhea.  Avoid foods with seeds, nuts, corn or popcorn.  Beginning the day after surgery, drink 6-8 glasses of water a day in addition to your meals.  Limit you caffeine intake to 1-2 servings per day.  Medication  Take your Miralax twice a day.  Take a stool softener like Colace twice daily  Take your pain medication as directed.  You may use Extra Strength Tylenol instead of your prescribed pain medication to relieve mild discomfort.  Avoid products containing aspirin.  Bowel Habits  You should have a bowel movement at least  every other day.  If you are constipated, you may take a Fleet enema or 2 Dulcolax tablets.  Call the office if no results occur.  You may bear down a normal amount to have a bowel movement without hurting your tissues after the operation.  Activity  Walking is encouraged.  You may drive when you are no longer on prescription pain medication.  You may go up and down stairs carefully.  No heavy lifting or strenuous activity until after your first post operative appointment  Do NOT sit on a rubber ring; instead use a soft pillow if needed.  Call the office if you have any questions or concerns.  Call IMMEDIATELY if you develop:  Rectal bleeding  Increasing rectal pain  Fever greater than 100 F  Difficulty urinating     Post Anesthesia Home Care Instructions  Activity: Get plenty of rest for the remainder of the day. A responsible adult should stay with you for 24 hours following the procedure.  For the next 24 hours, DO NOT: -Drive a car -Paediatric nurse -Drink alcoholic beverages -Take any medication unless instructed by your physician -Make any legal decisions or sign important papers.  Meals: Start with liquid foods such as gelatin or soup. Progress to regular foods as tolerated. Avoid greasy, spicy, heavy foods. If nausea and/or vomiting occur,  drink only clear liquids until the nausea and/or vomiting subsides. Call your physician if vomiting continues.  Special Instructions/Symptoms: Your throat may feel dry or sore from the anesthesia or the breathing tube placed in your throat during surgery. If this causes discomfort, gargle with warm salt water. The discomfort should disappear within 24 hours.  If you had a scopolamine patch placed behind your ear for the management of post- operative nausea and/or vomiting:  1. The medication in the patch is effective for 72 hours, after which it should be removed.  Wrap patch in a tissue and discard in the trash. Wash hands  thoroughly with soap and water. 2. You may remove the patch earlier than 72 hours if you experience unpleasant side effects which may include dry mouth, dizziness or visual disturbances. 3. Avoid touching the patch. Wash your hands with soap and water after contact with the patch.

## 2015-12-06 NOTE — Op Note (Signed)
12/06/2015  1:35 PM  PATIENT:  April Houston  70 y.o. female  Patient Care Team: Pcp Not In System as PCP - General  PRE-OPERATIVE DIAGNOSIS:  anal pain  POST-OPERATIVE DIAGNOSIS:  Perianal skin lesion  PROCEDURE: ANAL EXAM UNDER ANESTHESIA, INCISIONAL BIOPSY    Surgeon(s): Leighton Ruff, MD  ASSISTANT: none   ANESTHESIA:   local and MAC  SPECIMEN:  Source of Specimen:  L perianal lesion  DISPOSITION OF SPECIMEN:  PATHOLOGY  COUNTS:  YES  PLAN OF CARE: Discharge to home after PACU  PATIENT DISPOSITION:  PACU - hemodynamically stable.  INDICATION: 70 year old female who presents to the office with anal pain with bowel movements for over a year. She was unable to tolerate a completely anal examination in the office and therefore recommended exam under anesthesia. Risk and benefits of surgery were spine to the patient prior to the OR consent was signed placed on chart.   OR FINDINGS: Friable perianal skin and anoderm. Perianal rash mostly in the posterior region.  DESCRIPTION: the patient was identified in the preoperative holding area and taken to the OR where they were laid on the operating room table.  MAC anesthesia was induced without difficulty. The patient was then positioned in prone jackknife position with buttocks gently taped apart.  The patient was then prepped and draped in usual sterile fashion.  SCDs were noted to be in place prior to the initiation of anesthesia. A surgical timeout was performed indicating the correct patient, procedure, positioning and need for preoperative antibiotics.  A rectal block was performed using Marcaine with epinephrine.    I began with a digital rectal exam.  The patient did not have any speech or hypertension. There were no palpable masses.  I then placed a Hill-Ferguson anoscope into the anal canal and evaluated this completely.  The patient did not have any chronic-appearing fissures. She did have easily friable anoderm. I decided  to take a biopsy of the skin lesion. This was done with a 15 blade scalpel and sent to pathology for further examination. The biopsy site was closed with a 2-0 chromic suture. A rectal block with Exaparel was placed to help with postoperative pain control. The patient was then awakened from anesthesia and sent to the postanesthesia care unit in stable condition. All counts were correct per operating room staff.

## 2015-12-06 NOTE — Transfer of Care (Signed)
Immediate Anesthesia Transfer of Care Note  Patient: April Houston  Procedure(s) Performed: Procedure(s) (LRB): ANAL  EXAM UNDER ANESTHESIA, POSSIBLE BIOPSY (N/A)  Patient Location: PACU  Anesthesia Type: MAC  Level of Consciousness: awake, alert , oriented and patient cooperative  Airway & Oxygen Therapy: Patient Spontanous Breathing and Patient connected to face mask oxygen  Post-op Assessment: Report given to PACU RN and Post -op Vital signs reviewed and stable  Post vital signs: Reviewed and stable  Complications: No apparent anesthesia complications

## 2015-12-06 NOTE — Anesthesia Postprocedure Evaluation (Signed)
Anesthesia Post Note  Patient: April Houston  Procedure(s) Performed: Procedure(s) (LRB): ANAL  EXAM UNDER ANESTHESIA, BIOPSY (N/A)  Patient location during evaluation: PACU Anesthesia Type: MAC Level of consciousness: awake and alert Pain management: pain level controlled Vital Signs Assessment: post-procedure vital signs reviewed and stable Respiratory status: spontaneous breathing, nonlabored ventilation, respiratory function stable and patient connected to nasal cannula oxygen Cardiovascular status: stable and blood pressure returned to baseline Anesthetic complications: no    Last Vitals:  Filed Vitals:   12/06/15 1415 12/06/15 1515  BP: 159/81 160/82  Pulse: 76 69  Temp:  36.9 C  Resp: 14 16    Last Pain:  Filed Vitals:   12/06/15 1522  PainSc: Arthur Dyann Goodspeed

## 2015-12-06 NOTE — Anesthesia Preprocedure Evaluation (Signed)
Anesthesia Evaluation  Patient identified by MRN, date of birth, ID band Patient awake    Reviewed: Allergy & Precautions, NPO status , Patient's Chart, lab work & pertinent test results  Airway Mallampati: II  TM Distance: >3 FB Neck ROM: Full    Dental no notable dental hx.    Pulmonary neg pulmonary ROS,    Pulmonary exam normal breath sounds clear to auscultation       Cardiovascular negative cardio ROS Normal cardiovascular exam Rhythm:Regular Rate:Normal     Neuro/Psych negative neurological ROS  negative psych ROS   GI/Hepatic Neg liver ROS, hiatal hernia, GERD  Medicated and Controlled,  Endo/Other  negative endocrine ROS  Renal/GU negative Renal ROS  negative genitourinary   Musculoskeletal negative musculoskeletal ROS (+)   Abdominal   Peds negative pediatric ROS (+)  Hematology negative hematology ROS (+)   Anesthesia Other Findings   Reproductive/Obstetrics negative OB ROS                             Anesthesia Physical Anesthesia Plan  ASA: II  Anesthesia Plan: MAC   Post-op Pain Management:    Induction: Intravenous  Airway Management Planned: Simple Face Mask  Additional Equipment:   Intra-op Plan:   Post-operative Plan:   Informed Consent: I have reviewed the patients History and Physical, chart, labs and discussed the procedure including the risks, benefits and alternatives for the proposed anesthesia with the patient or authorized representative who has indicated his/her understanding and acceptance.   Dental advisory given  Plan Discussed with: CRNA  Anesthesia Plan Comments:         Anesthesia Quick Evaluation

## 2015-12-09 ENCOUNTER — Encounter (HOSPITAL_BASED_OUTPATIENT_CLINIC_OR_DEPARTMENT_OTHER): Payer: Self-pay | Admitting: General Surgery

## 2017-06-15 ENCOUNTER — Encounter: Payer: Self-pay | Admitting: Obstetrics and Gynecology

## 2017-06-15 ENCOUNTER — Ambulatory Visit (INDEPENDENT_AMBULATORY_CARE_PROVIDER_SITE_OTHER): Payer: 59 | Admitting: Obstetrics and Gynecology

## 2017-06-15 VITALS — BP 128/80 | HR 72 | Resp 16 | Ht 64.0 in | Wt 148.0 lb

## 2017-06-15 DIAGNOSIS — L900 Lichen sclerosus et atrophicus: Secondary | ICD-10-CM

## 2017-06-15 DIAGNOSIS — Z01419 Encounter for gynecological examination (general) (routine) without abnormal findings: Secondary | ICD-10-CM

## 2017-06-15 DIAGNOSIS — L940 Localized scleroderma [morphea]: Secondary | ICD-10-CM | POA: Diagnosis not present

## 2017-06-15 NOTE — Progress Notes (Addendum)
71 y.o. G0P0000 DivorcedCaucasianF here for annual exam.  She has a h/o a TAH/BSO for endometriosis.  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 She uses emuaidMAX (first aid ointment). No itching from the lichen sclerosis, sometimes has pain, currently burns when the urine hits the skin of her perineum. She has no other discomfort on the vulva. She is very irritated from the perianal morphea and see's a dermatologist.  Sometimes when she voids and then stands up she can leak a little urine.   She isn't sexually active, couldn't be if she wanted to (not interested).  She has issues with depression and anxiety. Symptoms helped with medication, functioning okay. Had a toxic work environment in the past, currently better.   No LMP recorded. Patient has had a hysterectomy.          Sexually active: No.  The current method of family planning is status post hysterectomy.    Exercising: Yes.    walking the dog  Smoker:  no  Health Maintenance: Pap:  Unsure- years ago History of abnormal Pap:  no MMG:  2017 WNL per patient  Colonoscopy:  2017 polyp - repeat in 10 yrs BMD:   2018  Osteopenia  TDaP:  2015 Gardasil: N/A   reports that she has never smoked. She has never used smokeless tobacco. She reports that she drinks about 0.6 oz of alcohol per week . She reports that she does not use drugs. She is an Surveyor, minerals, works for the city of [place]. Lives with her sister, she is disabled with mental illness. She feels responsible for her, it is difficult. She has dog that she adores.  Past Medical History:  Diagnosis Date  . Anal pain   . Depression   . GERD (gastroesophageal reflux disease)   . History of hiatal hernia   . History of panic attacks   . History of peptic ulcer   . IBS (irritable bowel syndrome)   . Wears glasses     Past Surgical History:  Procedure Laterality Date  . LAPAROSCOPIC NISSEN  FUNDOPLICATION  6160   and Repair Hiatal Hernia  . RECTAL EXAM UNDER ANESTHESIA N/A 12/06/2015   Procedure: ANAL  EXAM UNDER ANESTHESIA, BIOPSY;  Surgeon: Leighton Ruff, MD;  Location: [facility];  Service: General;  Laterality: N/A;  . TONSILLECTOMY  age 31  . TOTAL ABDOMINAL HYSTERECTOMY W/ BILATERAL SALPINGOOPHORECTOMY  1992    Current Outpatient Prescriptions  Medication Sig Dispense Refill  . ALPRAZolam (XANAX) 1 MG tablet Take 1 tab 3 times a day    . Desvenlafaxine Succinate (PRISTIQ PO) Take 1 tablet by mouth 3 (three) times daily.    . mirtazapine (REMERON) 30 MG tablet Take 30 mg by mouth.    . Polyethylene Glycol 3350 (MIRALAX PO) Take by mouth 2 (two) times daily.    . tacrolimus (PROTOPIC) 0.03 % ointment Apply topically 2 (two) times daily.     No current facility-administered medications for this visit.     No family history on file.  Review of Systems  Constitutional: Negative.   HENT: Negative.   Eyes: Negative.   Respiratory: Negative.   Cardiovascular: Negative.   Gastrointestinal: Negative.   Endocrine: Negative.   Genitourinary:       Vaginal irritation   Musculoskeletal: Negative.   Skin: Negative.   Allergic/Immunologic: Negative.   Neurological: Negative.   Psychiatric/Behavioral: Negative.     Exam:  BP 128/80 (BP Location: Right Arm, Patient Position: Sitting, Cuff Size: Normal)   Pulse 72   Resp 16   Ht 5\' 4"  (1.626 m)   Wt 148 lb (67.1 kg)   BMI 25.40 kg/m   Weight change: @WEIGHTCHANGE @ Height:   Height: 5\' 4"  (162.6 cm)  Ht Readings from Last 3 Encounters:  06/15/17 5\' 4"  (1.626 m)  12/06/15 5\' 4"  (1.626 m)  08/29/12 5\' 4"  (1.626 m)    General appearance: alert, cooperative and appears stated age Head: Normocephalic, without obvious abnormality, atraumatic Neck: no adenopathy, supple, symmetrical, trachea midline and thyroid with question of subtle enlargement of the right lobe compared to the left. Lungs: clear to  auscultation bilaterally Cardiovascular: regular rate and rhythm Breasts: normal appearance, no masses or tenderness Abdomen: soft, non-tender; bowel sounds normal; no masses,  no organomegaly Extremities: extremities normal, atraumatic, no cyanosis or edema Skin: Skin color, texture, turgor normal. She has a plaque of morphea under her right breast and on the right lower extremity  Lymph nodes: Cervical, supraclavicular, and axillary nodes normal. No abnormal inguinal nodes palpated Neurologic: Grossly normal   Pelvic: External genitalia:  no lesions, mild whitening and some agglutination of the labia minora to majora with some loss of architecture.              Urethra:  normal appearing urethra with no masses, tenderness or lesions              Bartholins and Skenes: normal                 Vagina: normal appearing vagina with normal color and discharge, no lesions              Cervix: absent               Bimanual Exam:  Uterus:  uterus absent              Adnexa: no mass, fullness, tenderness               Rectovaginal: deferrred               Perianal skin up to the perineum in [place], inflamed, fissures noted  Chaperone was present for exam.  A:  Well Woman with normal exam  Lichen Sclerosis, some loss of architecture, mild whitening, no concerning areas noted   Morphea, managed by dermatology  Possible increase in size of the right lobe of the thyroid compared to the left. Recommended she f/u with her primary for another opinion      P:   Mammogram   Colonoscopy UTD  Labs with primary MD  Discussed breast self exam  Discussed calcium and vit D intake  Discussed the option of using a steroid ointment 1 x a week on the vulva to prevent further loss of architecture.   She has a steroid ointment that the dermatologist gave her. She will call to let me know which steroid, she may be able to use that on the vulva    CC: Rosalyn Gess, NP  07/18/17 addendum: The patient is using  betamethasone dipropionate 0.5% ointment She can use that on her lichen sclerosis 2 x a week.

## 2017-06-15 NOTE — Patient Instructions (Signed)

## 2017-06-17 ENCOUNTER — Telehealth: Payer: Self-pay | Admitting: Obstetrics and Gynecology

## 2017-06-17 NOTE — Telephone Encounter (Signed)
Please let the patient know that she can use a pea sized amount of the betamethasone dipropionate 0.5% ointment on the lichen sclerosis 2 x a week to prevent further loss of architecture.

## 2017-06-17 NOTE — Telephone Encounter (Signed)
Patient calling to give Dr. Talbert Nan the name of the ointment she was prescribed from her dermatologist. Betamethasone Dipropionate .05 ointment.

## 2017-06-17 NOTE — Telephone Encounter (Signed)
Routing to Dr. Talbert Nan, please see patient message below and advise. Last OV 06/15/17.

## 2017-06-17 NOTE — Telephone Encounter (Signed)
Spoke with patient, advised as seen below per Dr. Talbert Nan. Patient verbalizes understanding and is agreeable.  Patient is agreeable to disposition. Will close encounter.

## 2017-06-27 ENCOUNTER — Emergency Department (HOSPITAL_BASED_OUTPATIENT_CLINIC_OR_DEPARTMENT_OTHER)
Admission: EM | Admit: 2017-06-27 | Discharge: 2017-06-27 | Disposition: A | Discharge status: Home / Self Care | Payer: 59 | Attending: Emergency Medicine | Admitting: Emergency Medicine

## 2017-06-27 ENCOUNTER — Emergency Department (HOSPITAL_BASED_OUTPATIENT_CLINIC_OR_DEPARTMENT_OTHER): Payer: 59

## 2017-06-27 ENCOUNTER — Encounter (HOSPITAL_BASED_OUTPATIENT_CLINIC_OR_DEPARTMENT_OTHER): Payer: Self-pay | Admitting: Emergency Medicine

## 2017-06-27 DIAGNOSIS — I10 Essential (primary) hypertension: Secondary | ICD-10-CM | POA: Insufficient documentation

## 2017-06-27 DIAGNOSIS — K5641 Fecal impaction: Secondary | ICD-10-CM

## 2017-06-27 DIAGNOSIS — K6289 Other specified diseases of anus and rectum: Secondary | ICD-10-CM | POA: Diagnosis present

## 2017-06-27 DIAGNOSIS — Z79899 Other long term (current) drug therapy: Secondary | ICD-10-CM | POA: Insufficient documentation

## 2017-06-27 DIAGNOSIS — K5909 Other constipation: Secondary | ICD-10-CM

## 2017-06-27 MED ORDER — MILK AND MOLASSES ENEMA
1.0000 | Freq: Once | RECTAL | Status: DC
  Filled 2017-06-27: qty 250

## 2017-06-27 MED ORDER — LIDOCAINE HCL 2 % EX GEL
1.0000 "application " | Freq: Once | CUTANEOUS | Status: AC
  Administered 2017-06-27: 1 via TOPICAL
  Filled 2017-06-27: qty 20

## 2017-06-27 MED ORDER — POLYETHYLENE GLYCOL 3350 17 GM/SCOOP PO POWD: 17.0000 g | Freq: Two times a day (BID) | ORAL | 0 refills | Status: DC

## 2017-06-27 MED ORDER — HYDROCORTISONE 2.5 % RE CREA: TOPICAL_CREAM | RECTAL | 0 refills | Status: DC

## 2017-06-27 NOTE — ED Notes (Signed)
Bladder scan shows 2108mL

## 2017-06-27 NOTE — ED Notes (Addendum)
Pt lying flat on L side. Alert, NAD, calm, interactive, resps e/u, speaking in clear complete sentences, no dyspnea noted, skin W&D, h/o morphea with thin rectal membranes, c/o severe rectal pain, and constipation, also inability to void, general weakness and new bleeding noted since she attempted self-disimpaction unsuccessfully, (denies: hemorrhoids, active fissures, abd pain, NVD, fever, sob, dizziness), polyp removed with last colonoscopy, has GI and gen.surg. Reports no relief with miralax, mag citrate, prune juice, increased water intake. States, "I am out of topical lidocaine, I don't care what you do to me tonight, but I don't want to feel anything".

## 2017-06-27 NOTE — ED Notes (Addendum)
EDPA into room. Family present.

## 2017-06-27 NOTE — ED Provider Notes (Signed)
[facility] Provider Note   CSN: [id number] Arrival date & time: 06/27/17  1713     History   Chief Complaint Chief Complaint  Patient presents with  . Rectal Pain    HPI April Houston is a 71 y.o. female.  April Houston is a 71 y.o. Female with a history of constipation, IBS and a Niesen fundoplication who presents to the emergency department complaining of rectal pain and constipation. Patient reports she typically takes MiraLAX and eats prunes every day but missed this for about 3 days. She reports then she became constipated and she feels she is impacted. She has had this before. She tells me today she took magnesium citrate and sat on the toilet for several hours. She reports pulling out hard balls of stool with her hands today. She is passing gas. No vomiting. She also reports she has not been able to urinate since 11 am today. She feels the need to urinate, but has not. She denies having any urinary symptoms prior to this. She denies abdominal pain, fevers, dysuria, vomiting, nausea.    The history is provided by the patient and medical records. No language interpreter was used.    Past Medical History:  Diagnosis Date  . Anal pain   . Anemia   . Anxiety   . Blood transfusion without reported diagnosis   . Depression   . Endometriosis   . GERD (gastroesophageal reflux disease)   . History of hiatal hernia   . History of panic attacks   . History of peptic ulcer   . Hypertension   . IBS (irritable bowel syndrome)   . Lichen sclerosus   . Wears glasses     There are no active problems to display for this patient.   Past Surgical History:  Procedure Laterality Date  . ABDOMINAL HYSTERECTOMY    . ABDOMINAL SURGERY    . LAPAROSCOPIC NISSEN FUNDOPLICATION  3790   and Repair Hiatal Hernia  . RECTAL EXAM UNDER ANESTHESIA N/A 12/06/2015   Procedure: ANAL  EXAM UNDER ANESTHESIA, BIOPSY;  Surgeon: Leighton Ruff, MD;  Location: [facility];   Service: General;  Laterality: N/A;  . TONSILLECTOMY  age 60  . TOTAL ABDOMINAL HYSTERECTOMY W/ BILATERAL SALPINGOOPHORECTOMY  1992    OB History    Gravida Para Term Preterm AB Living   0 0 0 0 0 0   SAB TAB Ectopic Multiple Live Births   0 0 0 0 0       Home Medications    Prior to Admission medications   Medication Sig Start Date End Date Taking? Authorizing Provider  ALPRAZolam Duanne Moron) 1 MG tablet Take 1 tab 3 times a day 06/01/17   [provider]  Desvenlafaxine Succinate (PRISTIQ PO) Take 1 tablet by mouth 3 (three) times daily.    [provider]  hydrocortisone (ANUSOL-HC) 2.5 % rectal cream Apply rectally 2 times daily 06/27/17   Waynetta Pean, PA-C  mirtazapine (REMERON) 30 MG tablet Take 30 mg by mouth. 06/01/17 11/28/17  [provider]  OVER THE COUNTER MEDICATION OTC EMUAID MAX- apply to vaginal and rectal area PRN    [provider]  polyethylene glycol powder (GLYCOLAX/MIRALAX) powder Take 17 g by mouth 2 (two) times daily. 06/27/17   Waynetta Pean, PA-C    Family History Family History  Problem Relation Age of Onset  . Liver cancer Mother   . Hypertension Mother   . Hyperlipidemia Mother   .  Stroke Mother   . Lung cancer Father     Social History Social History  Substance Use Topics  . Smoking status: Never Smoker  . Smokeless tobacco: Never Used  . Alcohol use 0.6 oz/week    1 Standard drinks or equivalent per week     Allergies   Hydrocodone; Other; and Ranitidine hcl   Review of Systems Review of Systems  Constitutional: Negative for chills and fever.  HENT: Negative for congestion and sore throat.   Eyes: Negative for visual disturbance.  Respiratory: Negative for cough, shortness of breath and wheezing.   Cardiovascular: Negative for chest pain and palpitations.  Gastrointestinal: Positive for constipation and rectal pain. Negative for abdominal distention, abdominal pain, diarrhea, nausea and vomiting.    Genitourinary: Positive for difficulty urinating. Negative for dysuria, frequency, hematuria and urgency.  Musculoskeletal: Negative for back pain.  Skin: Negative for rash.  Neurological: Negative for headaches.     Physical Exam Updated Vital Signs BP 128/84 (BP Location: Left Arm)   Pulse 94   Temp 97.7 F (36.5 C) (Oral)   Resp 20   Ht 5\' 4"  (1.626 m)   Wt 63.5 kg (140 lb)   SpO2 100%   BMI 24.03 kg/m   Physical Exam  Constitutional: She appears well-developed and well-nourished. No distress.  HENT:  Head: Normocephalic and atraumatic.  Mouth/Throat: Oropharynx is clear and moist.  Eyes: Pupils are equal, round, and reactive to light. Conjunctivae are normal. Right eye exhibits no discharge. Left eye exhibits no discharge.  Neck: Neck supple.  Cardiovascular: Normal rate, regular rhythm, normal heart sounds and intact distal pulses.   Pulmonary/Chest: Effort normal and breath sounds normal. No respiratory distress. She has no wheezes. She has no rales.  Abdominal: Soft. Bowel sounds are normal. She exhibits no distension. There is no tenderness. There is no guarding.  Abdomen soft and nontender palpation.  Genitourinary:  Genitourinary Comments: Rectal exam with female RN as chaperone. External hemorrhoid noted. Anal sphincter tone was normal. Patient has deep, large and soft stool ball in her rectal vault. Unable to tolerate much of an attempt at disimpaction.   Musculoskeletal: She exhibits no edema.  Lymphadenopathy:    She has no cervical adenopathy.  Neurological: She is alert. Coordination normal.  Skin: Skin is warm and dry. No rash noted. She is not diaphoretic. No erythema. No pallor.  Psychiatric: She has a normal mood and affect. Her behavior is normal.  Nursing note and vitals reviewed.    ED Treatments / Results  Labs (all labs ordered are listed, but only abnormal results are displayed) Labs Reviewed - No data to display  EKG  EKG  Interpretation None       Radiology Dg Abdomen 1 View  Result Date: 06/27/2017 CLINICAL DATA:  Patient has been unable to defecate for 3 days. EXAM: ABDOMEN - 1 VIEW COMPARISON:  None. FINDINGS: There is a large volume of stool in the rectosigmoid colon. Gaseous distention of the remainder of the colon is identified. No evidence of small bowel obstruction. IMPRESSION: Large rectosigmoid colon stool ball consistent with fecal impaction. There is gaseous distention of the remainder of the colon. Electronically Signed   By: Inge Rise M.D.   On: 06/27/2017 21:53    Procedures Procedures (including critical care time)  Medications Ordered in ED Medications  lidocaine (XYLOCAINE) 2 % jelly 1 application (1 application Topical Given 06/27/17 2215)     Initial Impression / Assessment and Plan / ED Course  I have reviewed the triage vital signs and the nursing notes.  Pertinent labs & imaging results that were available during my care of the patient were reviewed by me and considered in my medical decision making (see chart for details).    This is a 71 y.o. Female with a history of constipation, IBS and a Niesen fundoplication who presents to the emergency department complaining of rectal pain and constipation. Patient reports she typically takes MiraLAX and eats prunes every day but missed this for about 3 days. She reports then she became constipated and she feels she is impacted. She has had this before. She tells me today she took magnesium citrate and sat on the toilet for several hours. She reports pulling out hard balls of stool with her hands today. She is passing gas. No vomiting. She also reports she has not been able to urinate since 11 am today. She feels the need to urinate, but has not. She denies having any urinary symptoms prior to this.  On exam patient is afebrile and nontoxic-appearing. Her abdomen is soft and nontender to palpation. Bowel sounds are present. On rectal exam  patient does have an external hemorrhoid. She does have a large soft stool ball in her rectal vault. She would not tolerate much of an effort to disimpact. I discussed using lidocaine jelly and an enema. Will obtain plain film of her abdomen. She agrees with plan.  X-ray of her abdomen shows a large rectosigmoid colon stool ball consistent with fecal impaction. No evidence of small bowel obstruction. While preparing for the enema the patient sat on the toilet and had a very large bowel movement. At reevaluation she reports feeling much better. She has urinated. She feels ready to go home now. She did not require enema. Nursing staff report very large bowel movement present in bedside commode. We'll discharge with MiraLAX and Anusol cream. I discussed methods to help with her constipation at home. Encouraged a high-fiber diet as well as a more frequent MiraLAX until her stools are soft. I encouraged close follow-up by primary care. I advised the patient to follow-up with their primary care provider this week. I advised the patient to return to the emergency department with new or worsening symptoms or new concerns. The patient verbalized understanding and agreement with plan.     Final Clinical Impressions(s) / ED Diagnoses   Final diagnoses:  Fecal impaction ([place])  Other constipation    New Prescriptions Discharge Medication List as of 06/27/2017 10:32 PM    START taking these medications   Details  hydrocortisone (ANUSOL-HC) 2.5 % rectal cream Apply rectally 2 times daily, Print         Waynetta Pean, PA-C 06/27/17 2348    Charlesetta Shanks, MD 07/01/17 1924

## 2017-06-27 NOTE — ED Notes (Signed)
EDPA back into room with chaperones x2.

## 2017-06-27 NOTE — ED Notes (Addendum)
Pt reports thin skin around rectum and constipation with impaction today. Pt reports she normally takes miralax and drink prune juice daily. But has not for a couple days. Pt states she drank a bottle of mag citrate today and disimpacted some of the stool today but is still having a lot of rectal pain to the point she thinks she will pass out.

## 2017-06-27 NOTE — ED Notes (Addendum)
Pt had very large BM . Pt states she does not want enema at this time. PA made aware.

## 2017-06-27 NOTE — ED Notes (Signed)
Pt discharged to home with family. NAD.  

## 2017-06-27 NOTE — ED Triage Notes (Signed)
Patient states that she has a history of constipation and patient reports that she has an "impaction" - the patient is having large amounts of rectal pain, to the point of passing out.

## 2017-08-12 DIAGNOSIS — I809 Phlebitis and thrombophlebitis of unspecified site: Secondary | ICD-10-CM | POA: Insufficient documentation

## 2017-09-15 DIAGNOSIS — M79605 Pain in left leg: Secondary | ICD-10-CM | POA: Insufficient documentation

## 2017-10-22 ENCOUNTER — Encounter: Payer: Self-pay | Admitting: Internal Medicine

## 2017-11-30 ENCOUNTER — Other Ambulatory Visit (INDEPENDENT_AMBULATORY_CARE_PROVIDER_SITE_OTHER): Payer: 59

## 2017-11-30 ENCOUNTER — Encounter: Payer: Self-pay | Admitting: Internal Medicine

## 2017-11-30 ENCOUNTER — Ambulatory Visit: Payer: 59 | Admitting: Internal Medicine

## 2017-11-30 VITALS — BP 126/76 | HR 112 | Ht 64.0 in | Wt 143.4 lb

## 2017-11-30 DIAGNOSIS — K219 Gastro-esophageal reflux disease without esophagitis: Secondary | ICD-10-CM

## 2017-11-30 DIAGNOSIS — R112 Nausea with vomiting, unspecified: Secondary | ICD-10-CM

## 2017-11-30 DIAGNOSIS — R109 Unspecified abdominal pain: Secondary | ICD-10-CM

## 2017-11-30 DIAGNOSIS — K582 Mixed irritable bowel syndrome: Secondary | ICD-10-CM | POA: Diagnosis not present

## 2017-11-30 LAB — BASIC METABOLIC PANEL
BUN: 11 mg/dL (ref 6–23)
CHLORIDE: 101 meq/L (ref 96–112)
CO2: 26 meq/L (ref 19–32)
Calcium: 9.9 mg/dL (ref 8.4–10.5)
Creatinine, Ser: 1.18 mg/dL (ref 0.40–1.20)
GFR: 48.25 mL/min — ABNORMAL LOW (ref >60.00)
GLUCOSE: 148 mg/dL — AB (ref 70–99)
POTASSIUM: 3.6 meq/L (ref 3.5–5.1)
Sodium: 135 mEq/L (ref 135–145)

## 2017-11-30 MED ORDER — ONDANSETRON HCL 4 MG PO TABS: 4.0000 mg | ORAL_TABLET | ORAL | 3 refills | Status: DC | PRN

## 2017-11-30 MED ORDER — PANTOPRAZOLE SODIUM 40 MG PO TBEC: 40.0000 mg | DELAYED_RELEASE_TABLET | Freq: Every day | ORAL | 6 refills | Status: DC

## 2017-11-30 NOTE — Progress Notes (Addendum)
HISTORY OF PRESENT ILLNESS:  April Houston is a 72 y.o. 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 Apparently seen in this office many years ago, but we have no records as it has been so long. Also seen by different gastroenterologist elsewhere in the interim. She did not obtain any records for our review. In any event, her first complaint is that of problems with nausea and vomiting as well as abdominal discomfort in December. Since that time she tells me that she has had problems with reflux disease and reflux symptoms. She tells me that she had a Nissen fundoplication for reflux in the late 90s. She is concerned that her wrap may have, on done. She also had problems with nausea, vomiting, and abdominal pain yesterday. She describes to me a 20 year history of right upper quadrant pain with stress. This is present today. Also a generalized "raw" feeling of her abdomen throughout. Her bowel habits chronically alternate. She carries very as other GI diagnoses in addition to GERD including IBS, pancreatitis, and anal fissure. She is on no medication for her GERD stating that previous other GERD medications (not all have been tried) resultant headache. Problems with anal fissure are better when her bowels are more regulated. She takes MiraLAX. She appears depressed throughout the interview. She states she is under a lot of stress as an Web designer. She does have anxiety for which she takes Xanax. Review of outside records shows the patient was seen in the emergency room for anal fissure. Abdominal films from September 2018 showed changes consistent with fecal impaction. Abdominal ultrasound from 2013 was unremarkable. CT scan of the abdomen and pelvis from 2013 to evaluate right-sided abdominal pain was unremarkable. Laboratories including basic metabolic panel from earlier today revealed glucose of 148. She does not smoke.  Infrequent alcohol. She tells me that she had colonoscopy in 2017. Release has been signed and records requested  REVIEW OF SYSTEMS:  All non-GI ROS negative unless otherwise stated in the history of present illness except for sinus allergy, anxiety, confusion, depression, fatigue, headaches, night sweats, sleeping problems  Past Medical History:  Diagnosis Date  . Anal fissure   . Anal pain   . Anemia   . Anxiety   . Blood transfusion without reported diagnosis   . Chronic headaches   . Depression   . Endometriosis   . GERD (gastroesophageal reflux disease)   . Hiatal hernia   . Hypertension   . IBS (irritable bowel syndrome)   . Lichen sclerosus   . Pancreatitis   . Panic attacks   . Peptic ulcer   . PVC (premature ventricular contraction)   . Wears glasses     Past Surgical History:  Procedure Laterality Date  . ABDOMINAL SURGERY    . LAPAROSCOPIC NISSEN FUNDOPLICATION  3825   and Repair Hiatal Hernia  . LIPOMA EXCISION    . RECTAL EXAM UNDER ANESTHESIA N/A 12/06/2015   Procedure: ANAL  EXAM UNDER ANESTHESIA, BIOPSY;  Surgeon: Leighton Ruff, MD;  Location: [facility];  Service: General;  Laterality: N/A;  . TONSILLECTOMY  age 69  . TOTAL ABDOMINAL HYSTERECTOMY W/ BILATERAL SALPINGOOPHORECTOMY  1992    Social History April Houston  reports that  has never smoked. she has never used smokeless tobacco. She reports that she drinks about 0.6 oz of alcohol per week. She reports that she does not use drugs.  family history includes Cystic  fibrosis in her other; Diabetes in her mother; Hyperlipidemia in her mother; Hypertension in her mother; Irritable bowel syndrome in her mother, sister, and sister; Kidney cancer in her mother; Lung cancer in her father; Stroke in her mother.  Allergies  Allergen Reactions  . Hydrocodone Itching    severe  . Other Other (See Comments)    Severe headaches w/ most gerd medication IV and PO   . Zantac [Ranitidine Hcl]  Other (See Comments)    headaches       PHYSICAL EXAMINATION: Vital signs: BP 126/76 (BP Location: Left Arm, Patient Position: Sitting, Cuff Size: Normal)   Pulse (!) 112   Ht _0  (1.626 m) Comment: height measured without shoes  Wt 143 lb 6 oz (65 kg)   BMI 24.61 kg/m   Constitutional: generally well-appearing, no acute distress Psychiatric: alert and oriented x3, cooperative. Somewhat depressed-appearing Eyes: extraocular movements intact, anicteric, conjunctiva pink Mouth: oral pharynx moist, no lesions Neck: supple no lymphadenopathy Cardiovascular: heart regular rate and rhythm, no murmur Lungs: clear to auscultation bilaterally Abdomen: soft, nontender, nondistended, no obvious ascites, no peritoneal signs, normal bowel sounds, no organomegaly Rectal: Omitted Extremities: no lower extremity edema bilaterally Skin: no lesions on visible extremities Neuro: No focal deficits. No asterixis.    ASSESSMENT:  #1. GERD with reported history of fundoplication. Recurrent symptoms at present #2. Recurrent problems with abdominal pain, nausea, and vomiting. Rule out GERD related. Rule out partial bowel obstruction. Rule out pancreas disease #3. Reports outside GI evaluations. Requested #4. Irritable bowel syndrome with constipation predominant #5. Anxiety/depression #6. Gen. medical problems under the care of Dr. Ilene Qua    PLAN:  #1. Prescribe pantoprazole 40 mg daily for active GERD symptoms. She has not tried this before #2. Schedule upper endoscopy to evaluate abdominal pain, recurrent GERD symptoms, and assess fundoplication.The nature of the procedure, as well as the risks, benefits, and alternatives were carefully and thoroughly reviewed with the patient. Ample time for discussion and questions allowed. The patient understood, was satisfied, and agreed to proceed. #3. Schedule contrast-enhanced CT scan of the abdomen and pelvis to evaluate abdominal pain. Rule out  obstruction. Rule out pancreatitis. #4. Prescribe Zofran 4 mg every 4-6 hours as needed for nausea #5. Outside records and old chart from warehouse requested A copy of this consultation note has been sent to Dr. Ilene Qua #6. Laboratories today including basic metabolic panel  60 minutes has been spent face-to-face with the patient. Greater than 50% a time use for counseling regarding her myriad GI complaints, myriad of GI diagnoses, and the associated impression and plans.  ADDENDUM 12/10/2017: Chart from warehouse obtained. Summary as follows. 1. Complete colonoscopy 08/03/2001 evaluating diarrhea and abdominal bloating/pain. Normal ileum. Sigmoid diverticulosis and internal hemorrhoids. Otherwise normal colonoscopy. Random colon biopsies were normal. 2. Office note 06/21/2001. Chief complaint lower abdominal complaints and consideration of colonoscopy. Noted that she underwent several colonoscopies in [place] previously. States that she had a redo fundoplication in March 3300 after an initial fundoplication in 7622. 3. Upper endoscopy 07/01/2005 to evaluate abdominal pain, epigastric pain. The examination revealed a distal esophageal diverticulum, evidence of prior antireflux surgery, and duodenitis. Testing for Helicobacter pylori was negative.  ADDENDUM #2 03/02/2018: A colonoscopy report from Dr. Pincus Badder [facility] dated 04/09/2014. The examination was complete. Several diverticula in the sigmoid colon noted.A left-sided colon polyp was removed and found to be hyperplastic. No comment on prep quality made.

## 2017-11-30 NOTE — Patient Instructions (Signed)
We have sent the following medications to your pharmacy for you to pick up at your convenience:  Pantoprazole; Zofran  You have been scheduled for a CT scan of the abdomen and pelvis at [facility] ([address]---this is in the same building as Press [facility]).   You are scheduled on 12/01/2017 at 1:30pm. You should arrive 15 minutes prior to your appointment time for registration. Please follow the written instructions below on the day of your exam:  WARNING: IF YOU ARE ALLERGIC TO IODINE/X-RAY DYE, PLEASE NOTIFY RADIOLOGY IMMEDIATELY AT [phone]! YOU WILL BE GIVEN A 13 HOUR PREMEDICATION PREP.  1) Do not drink anything after 9:30am (4 hours prior to your test) 2) You have been given 2 bottles of oral contrast to drink. The solution may taste               better if refrigerated, but do NOT add ice or any other liquid to this solution. Shake             well before drinking.    Drink 1 bottle of contrast @ 11:30am (2 hours prior to your exam)  Drink 1 bottle of contrast @ 12:30pm (1 hour prior to your exam)  You may take any medications as prescribed with a small amount of water except for the following: Metformin, Glucophage, Glucovance, Avandamet, Riomet, Fortamet, Actoplus Met, Janumet, Glumetza or Metaglip. The above medications must be held the day of the exam AND 48 hours after the exam.  The purpose of you drinking the oral contrast is to aid in the visualization of your intestinal tract. The contrast solution may cause some diarrhea. Before your exam is started, you will be given a small amount of fluid to drink. Depending on your individual set of symptoms, you may also receive an intravenous injection of x-ray contrast/dye. Plan on being at [facility] for 30 minutes or long, depending on the type of exam you are having performed.  If you have any questions regarding your exam or if you need to reschedule, you may call the CT department at [phone]  between the hours of 8:00 am and 5:00 pm, Monday-Friday.  You have been scheduled for an endoscopy. Please follow written instructions given to you at your visit today. If you use inhalers (even only as needed), please bring them with you on the day of your procedure. Your physician has requested that you go to www.startemmi.com and enter the access code given to you at your visit today. This web site gives a general overview about your procedure. However, you should still follow specific instructions given to you by our office regarding your preparation for the procedure.       ________________________________________________________________________

## 2017-12-01 ENCOUNTER — Ambulatory Visit (INDEPENDENT_AMBULATORY_CARE_PROVIDER_SITE_OTHER)
Admission: RE | Admit: 2017-12-01 | Discharge: 2017-12-01 | Disposition: A | Discharge status: Home / Self Care | Payer: 59 | Source: Ambulatory Visit | Attending: Internal Medicine | Admitting: Internal Medicine

## 2017-12-01 DIAGNOSIS — R112 Nausea with vomiting, unspecified: Secondary | ICD-10-CM

## 2017-12-01 DIAGNOSIS — R109 Unspecified abdominal pain: Secondary | ICD-10-CM | POA: Diagnosis not present

## 2017-12-01 MED ORDER — IOPAMIDOL (ISOVUE-300) INJECTION 61%
100.0000 mL | Freq: Once | INTRAVENOUS | Status: AC | PRN
  Administered 2017-12-01: 100 mL via INTRAVENOUS

## 2017-12-02 ENCOUNTER — Other Ambulatory Visit: Payer: Self-pay

## 2017-12-02 ENCOUNTER — Telehealth: Payer: Self-pay | Admitting: Internal Medicine

## 2017-12-02 DIAGNOSIS — K219 Gastro-esophageal reflux disease without esophagitis: Secondary | ICD-10-CM

## 2017-12-02 NOTE — Telephone Encounter (Signed)
Pt reports she is having severe pain, states she can feel the outline of her colon, pain under her right breast all the way over to her left breast. Pts EGD moved to 12/06/17@8 :30am, pt aware of new appt and was instructed to go to the ER if the pain was severe. Pt happy with the new appt time.

## 2017-12-06 ENCOUNTER — Encounter: Payer: Self-pay | Admitting: Internal Medicine

## 2017-12-06 ENCOUNTER — Ambulatory Visit (AMBULATORY_SURGERY_CENTER): Payer: 59 | Admitting: Internal Medicine

## 2017-12-06 VITALS — BP 113/80 | HR 78 | Temp 97.3°F | Resp 11 | Ht 64.0 in | Wt 143.0 lb

## 2017-12-06 DIAGNOSIS — K253 Acute gastric ulcer without hemorrhage or perforation: Secondary | ICD-10-CM | POA: Diagnosis not present

## 2017-12-06 DIAGNOSIS — R112 Nausea with vomiting, unspecified: Secondary | ICD-10-CM

## 2017-12-06 MED ORDER — PANTOPRAZOLE SODIUM 40 MG PO TBEC
40.0000 mg | DELAYED_RELEASE_TABLET | Freq: Two times a day (BID) | ORAL | 11 refills | Status: DC
Start: 2017-12-06 — End: 2019-04-18

## 2017-12-06 MED ORDER — SODIUM CHLORIDE 0.9 % IV SOLN: 500.0000 mL | Freq: Once | INTRAVENOUS | Status: DC

## 2017-12-06 NOTE — Progress Notes (Signed)
Called to room to assist during endoscopic procedure.  Patient ID and intended procedure confirmed with present staff. Received instructions for my participation in the procedure from the performing physician.  

## 2017-12-06 NOTE — Progress Notes (Signed)
Report to PACU, RN, vss, BBS= Clear.  

## 2017-12-06 NOTE — Patient Instructions (Signed)
NO NSAIDS ( ADVIL, ALEVE, NAPROSYN ETC) OK TO USE TYLENOL FOR PAIN  PANTOPRAZOLE INCREASED TO TWICE DAILY.  REPEAT EGD IN 8 WEEKS.  YOU HAD AN ENDOSCOPIC PROCEDURE TODAY AT [facility]:   Refer to the procedure report that was given to you for any specific questions about what was found during the examination.  If the procedure report does not answer your questions, please call your gastroenterologist to clarify.  If you requested that your care partner not be given the details of your procedure findings, then the procedure report has been included in a sealed envelope for you to review at your convenience later.  YOU SHOULD EXPECT: Some feelings of bloating in the abdomen. Passage of more gas than usual.  Walking can help get rid of the air that was put into your GI tract during the procedure and reduce the bloating. If you had a lower endoscopy (such as a colonoscopy or flexible sigmoidoscopy) you may notice spotting of blood in your stool or on the toilet paper. If you underwent a bowel prep for your procedure, you may not have a normal bowel movement for a few days.  Please Note:  You might notice some irritation and congestion in your nose or some drainage.  This is from the oxygen used during your procedure.  There is no need for concern and it should clear up in a day or so.  SYMPTOMS TO REPORT IMMEDIATELY:    Following upper endoscopy (EGD)  Vomiting of blood or coffee ground material  New chest pain or pain under the shoulder blades  Painful or persistently difficult swallowing  New shortness of breath  Fever of 100F or higher  Black, tarry-looking stools  For urgent or emergent issues, a gastroenterologist can be reached at any hour by calling [phone].   DIET:  We do recommend a small meal at first, but then you may proceed to your regular diet.  Drink plenty of fluids but you should avoid alcoholic beverages for 24 hours.  ACTIVITY:  You should plan  to take it easy for the rest of today and you should NOT DRIVE or use heavy machinery until tomorrow (because of the sedation medicines used during the test).    FOLLOW UP: Our staff will call the number listed on your records the next business day following your procedure to check on you and address any questions or concerns that you may have regarding the information given to you following your procedure. If we do not reach you, we will leave a message.  However, if you are feeling well and you are not experiencing any problems, there is no need to return our call.  We will assume that you have returned to your regular daily activities without incident.  If any biopsies were taken you will be contacted by phone or by letter within the next 1-3 weeks.  Please call us at [phone] if you have not heard about the biopsies in 3 weeks.    SIGNATURES/CONFIDENTIALITY: You and/or your care partner have signed paperwork which will be entered into your electronic medical record.  These signatures attest to the fact that that the information above on your After Visit Summary has been reviewed and is understood.  Full responsibility of the confidentiality of this discharge information lies with you and/or your care-partner.

## 2017-12-06 NOTE — Op Note (Signed)
[facility] Patient Name: April Houston Procedure Date: 12/06/2017 8:53 AM MRN: [id number] Endoscopist: Docia Chuck. Henrene Pastor , MD Age: 72 Referring MD:  Date of Birth: May 18, 1946 Gender: Female Account #: [id number] Procedure:                Upper GI endoscopy, with biopsies Indications:              Abdominal pain, Esophageal reflux Medicines:                Monitored Anesthesia Care Procedure:                Pre-Anesthesia Assessment:                           - Prior to the procedure, a History and Physical                            was performed, and patient medications and                            allergies were reviewed. The patient's tolerance of                            previous anesthesia was also reviewed. The risks                            and benefits of the procedure and the sedation                            options and risks were discussed with the patient.                            All questions were answered, and informed consent                            was obtained. Prior Anticoagulants: The patient has                            taken no previous anticoagulant or antiplatelet                            agents. ASA Grade Assessment: II - A patient with                            mild systemic disease. After reviewing the risks                            and benefits, the patient was deemed in                            satisfactory condition to undergo the procedure.                           After obtaining informed consent, the endoscope was  passed under direct vision. Throughout the                            procedure, the patient's blood pressure, pulse, and                            oxygen saturations were monitored continuously. The                            Model GIF-HQ190 [id number]) scope was introduced                            through the mouth, and advanced to the second part                            of  duodenum. The upper GI endoscopy was                            accomplished without difficulty. The patient                            tolerated the procedure well. Scope In: Scope Out: Findings:                 The esophagus was normal.                           One non-bleeding gastric ulcer was found in the                            prepyloric region of the stomach. The ulcer was                            linear measuringa maximum width of 1 cm and                            semicircular involving approximately 60% of the                            prepyloric antrum. See photograph. Biopsies were                            taken with a cold forceps for Helicobacter pylori                            testing using CLOtest. Stomach also revealed a                            hiatal hernia and evidence of prior fundoplication                            with slipped wrap.                           The examined duodenum was normal.  The cardia and gastric fundus were normal on                            retroflexion, save evidence of hernia and slipped                            wrap. Complications:            No immediate complications. Estimated Blood Loss:     Estimated blood loss: none. Impression:               1. Large prepyloric ulcer status post CLO biopsy                           2. Slipped wrap with a small hiatal hernia                           3. Otherwise normal EGD Recommendation:           1. Increase pantoprazole to 40 mg twice daily; #60;                            11 refills                           2. Please avoid all NSAIDs such as ibuprofen,                            Advil, Aleve, aspirin, etc. Okay to use Tylenol for                            aches and pains                           3. Repeat EGD in the [place] in 8 weeks to assess for                            ulcer healing. Docia Chuck. Henrene Pastor, MD 12/06/2017 9:11:15 AM This report has been  signed electronically.

## 2017-12-06 NOTE — Progress Notes (Signed)
Pt's states no medical or surgical changes since previsit or office visit. 

## 2017-12-07 ENCOUNTER — Telehealth: Payer: Self-pay | Admitting: *Deleted

## 2017-12-07 LAB — HELICOBACTER PYLORI SCREEN-BIOPSY: UREASE: NEGATIVE

## 2017-12-07 NOTE — Telephone Encounter (Signed)
  Follow up Call-  Call back number 12/06/2017  Post procedure Call Back phone  # [phone]  Permission to leave phone message Yes  Some recent data might be hidden     Patient questions:  Do you have a fever, pain , or abdominal swelling? No. Pain Score  0 *  Have you tolerated food without any problems? Yes.    Have you been able to return to your normal activities? Yes.    Do you have any questions about your discharge instructions: Diet   No. Medications  No. Follow up visit  No.  Do you have questions or concerns about your Care? No.  Actions: * If pain score is 4 or above: No action needed, pain <4.

## 2017-12-08 ENCOUNTER — Encounter: Payer: Self-pay | Admitting: Internal Medicine

## 2018-01-10 ENCOUNTER — Encounter: Payer: 59 | Admitting: Internal Medicine

## 2018-01-18 ENCOUNTER — Ambulatory Visit (AMBULATORY_SURGERY_CENTER): Payer: Self-pay

## 2018-01-18 ENCOUNTER — Encounter: Payer: Self-pay | Admitting: Internal Medicine

## 2018-01-18 VITALS — Ht 64.0 in | Wt 146.4 lb

## 2018-01-18 DIAGNOSIS — K253 Acute gastric ulcer without hemorrhage or perforation: Secondary | ICD-10-CM

## 2018-01-18 NOTE — Progress Notes (Signed)
Per pt, no allergies to soy or egg products.Pt not taking any weight loss meds or using  O2 at home.  Pt refused emmi video. 

## 2018-01-31 ENCOUNTER — Ambulatory Visit (AMBULATORY_SURGERY_CENTER): Payer: 59 | Admitting: Internal Medicine

## 2018-01-31 ENCOUNTER — Encounter: Payer: 59 | Admitting: Internal Medicine

## 2018-01-31 ENCOUNTER — Other Ambulatory Visit: Payer: Self-pay

## 2018-01-31 ENCOUNTER — Encounter: Payer: Self-pay | Admitting: Internal Medicine

## 2018-01-31 VITALS — BP 124/78 | HR 66 | Temp 97.5°F | Resp 12 | Ht 64.0 in | Wt 146.0 lb

## 2018-01-31 DIAGNOSIS — K253 Acute gastric ulcer without hemorrhage or perforation: Secondary | ICD-10-CM

## 2018-01-31 DIAGNOSIS — Z8719 Personal history of other diseases of the digestive system: Secondary | ICD-10-CM

## 2018-01-31 MED ORDER — SODIUM CHLORIDE 0.9 % IV SOLN: 500.0000 mL | Freq: Once | INTRAVENOUS | Status: DC

## 2018-01-31 NOTE — Op Note (Signed)
[facility] Patient Name: April Houston Procedure Date: 01/31/2018 9:24 AM MRN: [id number] Endoscopist: Docia Chuck. Henrene Pastor , MD Age: 72 Referring MD:  Date of Birth: 26-Jan-1946 Gender: Female Account #: [id number] Procedure:                Upper GI endoscopy Indications:              Follow-up of acute gastric ulcer Medicines:                Monitored Anesthesia Care Procedure:                Pre-Anesthesia Assessment:                           - Prior to the procedure, a History and Physical                            was performed, and patient medications and                            allergies were reviewed. The patient's tolerance of                            previous anesthesia was also reviewed. The risks                            and benefits of the procedure and the sedation                            options and risks were discussed with the patient.                            All questions were answered, and informed consent                            was obtained. Prior Anticoagulants: The patient has                            taken no previous anticoagulant or antiplatelet                            agents. ASA Grade Assessment: II - A patient with                            mild systemic disease. After reviewing the risks                            and benefits, the patient was deemed in                            satisfactory condition to undergo the procedure.                           After obtaining informed consent, the endoscope was  passed under direct vision. Throughout the                            procedure, the patient's blood pressure, pulse, and                            oxygen saturations were monitored continuously. The                            Endoscope was introduced through the mouth, and                            advanced to the second part of duodenum. The upper                            GI endoscopy was  accomplished without difficulty.                            The patient tolerated the procedure well. Scope In: Scope Out: Findings:                 The esophagus was normal.                           The stomach revealed retained gastric contents and                            evidence of prior fundoplication (slipped wrap).                            Previous ulcer had healed though there was                            deformity in the region of the prepyloric antrum                            without stenosis or obstruction.                           The examined duodenum was normal.                           The cardia and gastric fundus were normal on                            retroflexion, save previous comments. Complications:            No immediate complications. Estimated Blood Loss:     Estimated blood loss: none. Impression:               1. Previous ulcer healed                           2. Gastric emptying                           3. Prior fundoplication  with slipped wrap. Recommendation:           1. Continue pantoprazole twice daily                           2. Chew food well                           3. Continue to avoid NSAIDs. Tylenol okay for pain                           4. Office follow-up with Dr. Henrene Pastor in about 8 weeks. Docia Chuck. Henrene Pastor, MD 01/31/2018 9:48:00 AM This report has been signed electronically.

## 2018-01-31 NOTE — Progress Notes (Signed)
Report to PACU, RN, vss, BBS= Clear.  

## 2018-01-31 NOTE — Patient Instructions (Addendum)
Discharge instructions given. Continue to avoid NSAIDS. Tylenol okay for pain. Office follow up with Dr. Henrene Pastor in about 8 weeks. Office will call. Resume previous medications. YOU HAD AN ENDOSCOPIC PROCEDURE TODAY AT [facility]:   Refer to the procedure report that was given to you for any specific questions about what was found during the examination.  If the procedure report does not answer your questions, please call your gastroenterologist to clarify.  If you requested that your care partner not be given the details of your procedure findings, then the procedure report has been included in a sealed envelope for you to review at your convenience later.  YOU SHOULD EXPECT: Some feelings of bloating in the abdomen. Passage of more gas than usual.  Walking can help get rid of the air that was put into your GI tract during the procedure and reduce the bloating. If you had a lower endoscopy (such as a colonoscopy or flexible sigmoidoscopy) you may notice spotting of blood in your stool or on the toilet paper. If you underwent a bowel prep for your procedure, you may not have a normal bowel movement for a few days.  Please Note:  You might notice some irritation and congestion in your nose or some drainage.  This is from the oxygen used during your procedure.  There is no need for concern and it should clear up in a day or so.  SYMPTOMS TO REPORT IMMEDIATELY:   Following upper endoscopy (EGD)  Vomiting of blood or coffee ground material  New chest pain or pain under the shoulder blades  Painful or persistently difficult swallowing  New shortness of breath  Fever of 100F or higher  Black, tarry-looking stools  For urgent or emergent issues, a gastroenterologist can be reached at any hour by calling [phone].   DIET:  We do recommend a small meal at first, but then you may proceed to your regular diet.  Drink plenty of fluids but you should avoid alcoholic beverages for  24 hours.  ACTIVITY:  You should plan to take it easy for the rest of today and you should NOT DRIVE or use heavy machinery until tomorrow (because of the sedation medicines used during the test).    FOLLOW UP: Our staff will call the number listed on your records the next business day following your procedure to check on you and address any questions or concerns that you may have regarding the information given to you following your procedure. If we do not reach you, we will leave a message.  However, if you are feeling well and you are not experiencing any problems, there is no need to return our call.  We will assume that you have returned to your regular daily activities without incident.  If any biopsies were taken you will be contacted by phone or by letter within the next 1-3 weeks.  Please call us at [phone] if you have not heard about the biopsies in 3 weeks.    SIGNATURES/CONFIDENTIALITY: You and/or your care partner have signed paperwork which will be entered into your electronic medical record.  These signatures attest to the fact that that the information above on your After Visit Summary has been reviewed and is understood.  Full responsibility of the confidentiality of this discharge information lies with you and/or your care-partner.

## 2018-01-31 NOTE — Progress Notes (Signed)
Pt's states no medical or surgical changes since previsit or office visit. 

## 2018-02-01 ENCOUNTER — Telehealth: Payer: Self-pay

## 2018-02-01 NOTE — Telephone Encounter (Signed)
  Follow up Call-  Call back number 01/31/2018 12/06/2017  Post procedure Call Back phone  # [phone]  Permission to leave phone message Yes Yes  Some recent data might be hidden     Patient questions:  Do you have a fever, pain , or abdominal swelling? No. Pain Score  0 *  Have you tolerated food without any problems? Yes.    Have you been able to return to your normal activities? Yes.    Do you have any questions about your discharge instructions: Diet   No. Medications  No. Follow up visit  No.  Do you have questions or concerns about your Care? No.  Actions: * If pain score is 4 or above: No action needed, pain <4.

## 2018-02-10 DIAGNOSIS — K219 Gastro-esophageal reflux disease without esophagitis: Secondary | ICD-10-CM | POA: Insufficient documentation

## 2018-02-10 DIAGNOSIS — E559 Vitamin D deficiency, unspecified: Secondary | ICD-10-CM | POA: Insufficient documentation

## 2018-03-30 ENCOUNTER — Ambulatory Visit: Payer: 59 | Admitting: Internal Medicine

## 2018-05-04 ENCOUNTER — Encounter: Payer: Self-pay | Admitting: Internal Medicine

## 2018-05-04 ENCOUNTER — Ambulatory Visit: Payer: 59 | Admitting: Internal Medicine

## 2018-05-04 VITALS — BP 120/76 | HR 81 | Ht 64.0 in | Wt 158.0 lb

## 2018-05-04 DIAGNOSIS — K259 Gastric ulcer, unspecified as acute or chronic, without hemorrhage or perforation: Secondary | ICD-10-CM

## 2018-05-04 DIAGNOSIS — K3184 Gastroparesis: Secondary | ICD-10-CM

## 2018-05-04 DIAGNOSIS — R109 Unspecified abdominal pain: Secondary | ICD-10-CM

## 2018-05-04 DIAGNOSIS — K219 Gastro-esophageal reflux disease without esophagitis: Secondary | ICD-10-CM | POA: Diagnosis not present

## 2018-05-04 DIAGNOSIS — R112 Nausea with vomiting, unspecified: Secondary | ICD-10-CM | POA: Diagnosis not present

## 2018-05-04 NOTE — Progress Notes (Signed)
HISTORY OF PRESENT ILLNESS:  April Houston is a 72 y.o. female who was evaluated 11/30/2017 for GERD, abdominal pain, nausea and vomiting, and weight loss. She was placed on pantoprazole. CT scan was negative for acute abnormalities. Upper endoscopy was performed following week and revealed significant antral ulceration. Evidence of prior fundoplication. Negative testing for Helicobacter pylori. She had been a chronic NSAID user for headaches. Stop NSAIDs. Placed on PPI. Follow-up EGD 01/31/2018 demonstrated ulcer healing. Retained solid gastric contents noted. As well as prior fundoplication with slipped wrap. She was continued on pantoprazole twice daily, old to chew her food well, told to avoid NSAIDs, Tylenol okay for pain, and follow-up at this time. Patient is pleased report that she has had no further nausea or vomiting. No abdominal pain. She is eating well. Since her initial visit in February she has gained 15 pounds. She continues to complain of daily headaches for which Tylenol or ineffective. She also tells me that she takes her PPI twice daily causes headache. She is taking PPI once daily.In terms of prior colonoscopy, she has had several including 2002 here and 2015 elsewhere. Diverticulosis and hyperplastic polyp. Routine follow-up 2025.  REVIEW OF SYSTEMS:  All non-GI ROS negative except for headaches, insomnia, sinus and allergy, depression, anxiety  Past Medical History:  Diagnosis Date  . Anal fissure   . Anal pain   . Anemia   . Anxiety   . Blood transfusion without reported diagnosis 2002   After Nissen Fundiplication  . Cataract   . Chronic headaches   . Depression   . Endometriosis   . GERD (gastroesophageal reflux disease)   . Hiatal hernia   . Hypertension    no meds/ in past  . IBS (irritable bowel syndrome)   . Lichen sclerosus   . Pancreatitis   . Panic attacks   . Peptic ulcer   . PVC (premature ventricular contraction)   . SOB (shortness of breath)    with panic attack and anxiety  . Wears glasses     Past Surgical History:  Procedure Laterality Date  . ABDOMINAL SURGERY    . CATARACT EXTRACTION, BILATERAL     Bil/ In 2018  . LAPAROSCOPIC NISSEN FUNDOPLICATION  2778   and Repair Hiatal Hernia  . LIPOMA EXCISION    . RECTAL EXAM UNDER ANESTHESIA N/A 12/06/2015   Procedure: ANAL  EXAM UNDER ANESTHESIA, BIOPSY;  Surgeon: Leighton Ruff, MD;  Location: [facility];  Service: General;  Laterality: N/A;  . TONSILLECTOMY  age 24  . TOTAL ABDOMINAL HYSTERECTOMY W/ BILATERAL SALPINGOOPHORECTOMY  1992    Social History April Houston  reports that she has never smoked. She has never used smokeless tobacco. She reports that she drank alcohol. She reports that she does not use drugs.  family history includes Cystic fibrosis in her other; Diabetes in her mother; Hyperlipidemia in her mother; Hypertension in her mother; Irritable bowel syndrome in her mother, sister, and sister; Kidney cancer in her mother; Lung cancer in her father; Stroke in her mother.  Allergies  Allergen Reactions  . Hydrocodone Itching    severe  . Other Other (See Comments)    Severe headaches w/ most gerd medication IV and PO   . Zantac [Ranitidine Hcl] Other (See Comments)    headaches       PHYSICAL EXAMINATION: Vital signs: BP 120/76   Pulse 81   Ht 5\' 4"  (1.626 m)   Wt 158 lb (71.7 kg)  SpO2 96%   BMI 27.12 kg/m   Constitutional: generally well-appearing, no acute distress Psychiatric: alert and oriented x3, cooperative Eyes: extraocular movements intact, anicteric, conjunctiva pink Mouth: oral pharynx moist, no lesions Neck: supple no lymphadenopathy Cardiovascular: heart regular rate and rhythm, no murmur Lungs: clear to auscultation bilaterally Abdomen: soft, nontender, nondistended, no obvious ascites, no peritoneal signs, normal bowel sounds, no organomegaly. No succussion splash Rectal:omitted Extremities: no clubbing,  cyanosis, or lower extremity edema bilaterally Skin: no lesions on visible extremities Neuro: No focal deficits. Cranial nerves intact  ASSESSMENT:  #1. Prepyloric antral ulceration secondary to NSAIDs to account for patient's previous problems with abdominal pain, nausea, and vomiting #2. Evidence of gastroparesis on most recent endoscopy. 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 No culprit medications. Nondiabetic. Could be idiopathic but suspect iatrogenic #3. Colon cancer screening. Up-to-date. Last exam elsewhere 2015   PLAN:  #1. Continue pantoprazole 40 mg daily #2. Continue to avoid all NSAIDs #3. Recommended that she seek help at a headache clinic regarding chronic headaches and her inability to use NSAIDs as a remedy #4. Colonoscopy around 2025. Patient aware #5. Routine GI office follow-up one year  25 minutes spent face-to-face with the patient. Greater than 50% a time use for counseling regarding her ulcer disease, recommendations regarding chronic pain management, and gastroparesis. We also discussed the results for her last colonoscopy and recommended routine follow-up interval

## 2018-05-04 NOTE — Patient Instructions (Signed)
Please follow up in one year 

## 2018-06-17 NOTE — Progress Notes (Deleted)
72 y.o. G0P0000 DivorcedCaucasianF here for annual exam.      No LMP recorded. Patient has had a hysterectomy.          Sexually active: {yes no:314532}  The current method of family planning is {contraception:315051}.    Exercising: {yes no:314532}  {types:19826} Smoker:  {YES NO:22349}  Health Maintenance: Pap:  *** History of abnormal Pap:  {YES NO:22349} MMG:  *** Colonoscopy:  *** BMD:   *** TDaP:  *** Gardasil: ***   reports that she has never smoked. She has never used smokeless tobacco. She reports that she drank alcohol. She reports that she does not use drugs.  Past Medical History:  Diagnosis Date  . Anal fissure   . Anal pain   . Anemia   . Anxiety   . Blood transfusion without reported diagnosis 2002   After Nissen Fundiplication  . Cataract   . Chronic headaches   . Depression   . Endometriosis   . GERD (gastroesophageal reflux disease)   . Hiatal hernia   . Hypertension    no meds/ in past  . IBS (irritable bowel syndrome)   . Lichen sclerosus   . Pancreatitis   . Panic attacks   . Peptic ulcer   . PVC (premature ventricular contraction)   . SOB (shortness of breath)    with panic attack and anxiety  . Wears glasses     Past Surgical History:  Procedure Laterality Date  . ABDOMINAL SURGERY    . CATARACT EXTRACTION, BILATERAL     Bil/ In 2018  . LAPAROSCOPIC NISSEN FUNDOPLICATION  1610   and Repair Hiatal Hernia  . LIPOMA EXCISION    . RECTAL EXAM UNDER ANESTHESIA N/A 12/06/2015   Procedure: ANAL  EXAM UNDER ANESTHESIA, BIOPSY;  Surgeon: Leighton Ruff, MD;  Location: [facility];  Service: General;  Laterality: N/A;  . TONSILLECTOMY  age 58  . TOTAL ABDOMINAL HYSTERECTOMY W/ BILATERAL SALPINGOOPHORECTOMY  1992    Current Outpatient Medications  Medication Sig Dispense Refill  . ALPRAZolam (XANAX) 1 MG tablet Take 1 tab 3 times a day    . Desvenlafaxine Succinate (PRISTIQ PO) Take 1 tablet by mouth daily.     Marland Kitchen docusate  sodium (STOOL SOFTENER) 100 MG capsule Take 100 mg by mouth daily.    Marland Kitchen gabapentin (NEURONTIN) 100 MG capsule 1 capsule 3 (three) times daily.  5  . mirtazapine (REMERON) 30 MG tablet Take 30 mg by mouth daily.     . ondansetron (ZOFRAN) 4 MG tablet Take 1 tablet (4 mg total) by mouth every 4 (four) hours as needed for nausea or vomiting. 30 tablet 3  . OVER THE COUNTER MEDICATION OTC EMUAID MAX- apply to vaginal and rectal area PRN    . pantoprazole (PROTONIX) 40 MG tablet Take 1 tablet (40 mg total) by mouth 2 (two) times daily. (Patient taking differently: Take 40 mg by mouth daily. ) 60 tablet 11  . polyethylene glycol powder (GLYCOLAX/MIRALAX) powder Take 17 g by mouth 2 (two) times daily. 255 g 0  . psyllium (METAMUCIL) 58.6 % powder Take 1 packet by mouth daily.     Current Facility-Administered Medications  Medication Dose Route Frequency Provider Last Rate Last Dose  . 0.9 %  sodium chloride infusion  500 mL Intravenous Once Irene Shipper, MD      . 0.9 %  sodium chloride infusion  500 mL Intravenous Once Irene Shipper, MD  Family History  Problem Relation Age of Onset  . Hypertension Mother   . Hyperlipidemia Mother   . Stroke Mother   . Diabetes Mother   . Irritable bowel syndrome Mother   . Kidney cancer Mother   . Lung cancer Father   . Irritable bowel syndrome Sister   . Cystic fibrosis Other   . Irritable bowel syndrome Sister   . Esophageal cancer Neg Hx   . Stomach cancer Neg Hx     Review of Systems  Constitutional: Negative.   HENT: Negative.   Eyes: Negative.   Respiratory: Negative.   Cardiovascular: Negative.   Gastrointestinal: Negative.   Endocrine: Negative.   Genitourinary: Negative.   Musculoskeletal: Negative.   Skin: Negative.   Allergic/Immunologic: Negative.   Neurological: Negative.   Hematological: Negative.   Psychiatric/Behavioral: Negative.   All other systems reviewed and are negative.   Exam:   There were no vitals taken  for this visit.  Weight change: @WEIGHTCHANGE @ Height:      Ht Readings from Last 3 Encounters:  05/04/18 5\' 4"  (1.626 m)  01/31/18 5\' 4"  (1.626 m)  01/18/18 5\' 4"  (1.626 m)    General appearance: alert, cooperative and appears stated age Head: Normocephalic, without obvious abnormality, atraumatic Neck: no adenopathy, supple, symmetrical, trachea midline and thyroid {CHL AMB PHY EX THYROID NORM DEFAULT:[phone]::"normal to inspection and palpation"} Lungs: clear to auscultation bilaterally Cardiovascular: regular rate and rhythm Breasts: {Exam; breast:13139::"normal appearance, no masses or tenderness"} Abdomen: soft, non-tender; non distended,  no masses,  no organomegaly Extremities: extremities normal, atraumatic, no cyanosis or edema Skin: Skin color, texture, turgor normal. No rashes or lesions Lymph nodes: Cervical, supraclavicular, and axillary nodes normal. No abnormal inguinal nodes palpated Neurologic: Grossly normal   Pelvic: External genitalia:  no lesions              Urethra:  normal appearing urethra with no masses, tenderness or lesions              Bartholins and Skenes: normal                 Vagina: normal appearing vagina with normal color and discharge, no lesions              Cervix: {CHL AMB PHY EX CERVIX NORM DEFAULT:[phone]::"no lesions"}               Bimanual Exam:  Uterus:  {CHL AMB PHY EX UTERUS NORM [phone]::"normal size, contour, position, consistency, mobility, non-tender"}              Adnexa: {CHL AMB PHY EX ADNEXA NO MASS DEFAULT:[phone]::"no mass, fullness, tenderness"}               Rectovaginal: Confirms               Anus:  normal sphincter tone, no lesions  Chaperone was present for exam.  A:  Well Woman with normal exam  P:

## 2018-06-23 ENCOUNTER — Ambulatory Visit: Payer: 59 | Admitting: Obstetrics and Gynecology

## 2018-08-15 DIAGNOSIS — F330 Major depressive disorder, recurrent, mild: Secondary | ICD-10-CM | POA: Insufficient documentation

## 2018-10-28 ENCOUNTER — Ambulatory Visit: Payer: 59 | Admitting: Internal Medicine

## 2018-10-28 ENCOUNTER — Encounter: Payer: Self-pay | Admitting: Internal Medicine

## 2018-10-28 VITALS — BP 94/62 | HR 88 | Ht 64.0 in | Wt 153.0 lb

## 2018-10-28 DIAGNOSIS — K219 Gastro-esophageal reflux disease without esophagitis: Secondary | ICD-10-CM

## 2018-10-28 DIAGNOSIS — R4702 Dysphasia: Secondary | ICD-10-CM

## 2018-10-28 DIAGNOSIS — K3184 Gastroparesis: Secondary | ICD-10-CM | POA: Diagnosis not present

## 2018-10-28 MED ORDER — DEXLANSOPRAZOLE 60 MG PO CPDR: 60.0000 mg | DELAYED_RELEASE_CAPSULE | Freq: Two times a day (BID) | ORAL | 3 refills | Status: DC

## 2018-10-28 NOTE — Progress Notes (Signed)
HISTORY OF PRESENT ILLNESS:  April Houston is a 73 y.o. female with chronic headaches a history of severe GERD status post remote fundoplication with subsequent redo fundoplication elsewhere.  Reestablished with this office November 30, 2017 regarding GERD, abdominal pain, and nausea with vomiting.  Also weight loss.  See that dictation for details.  CT imaging was negative.  Upper endoscopy revealed severe antral ulceration secondary to NSAIDs.  Follow-up endoscopy on PPI revealed healing.  Significant retained gastric contents consistent with gastroparesis, possibly secondary to prior gastric surgery.  Office follow-up May 04, 2018.  Doing well at that time on pantoprazole 40 mg daily.  Routine follow-up in 1 year recommended.  [name] as manifested by regurgitation, pyrosis, and raspy voice.  Also mild dysphasia.  She has lost 13 pounds.  She increased her pantoprazole to 40 mg twice daily.  Still with breakthrough symptoms requiring antacids.  [name].  REVIEW OF SYSTEMS:  All non-GI ROS negative unless otherwise stated in the HPI except sinus and allergy, anxiety, arthritis, back pain, confusion, depression, fatigue, night sweats, irregular heartbeat, headaches, sleeping problems, excessive urination, shortness of breath  Past Medical History:  Diagnosis Date  . Anal fissure   . Anal pain   . Anemia   . Anxiety   . Blood transfusion without reported diagnosis 2002   After Nissen Fundiplication  . Cataract   . Chronic headaches   . Depression   . Endometriosis   . GERD (gastroesophageal reflux disease)   . Hiatal hernia   . Hypertension    no meds/ in past  . IBS (irritable bowel syndrome)   . Lichen sclerosus   . Pancreatitis   . Panic attacks   . Peptic ulcer   . PVC (premature ventricular contraction)   . SOB (shortness of breath)    with panic attack and anxiety  . Wears glasses      Past Surgical History:  Procedure Laterality Date  . ABDOMINAL SURGERY    . CATARACT EXTRACTION, BILATERAL     Bil/ In 2018  . LAPAROSCOPIC NISSEN FUNDOPLICATION  1610   and Repair Hiatal Hernia  . LIPOMA EXCISION    . RECTAL EXAM UNDER ANESTHESIA N/A 12/06/2015   Procedure: ANAL  EXAM UNDER ANESTHESIA, BIOPSY;  Surgeon: Leighton Ruff, MD;  Location: [facility];  Service: General;  Laterality: N/A;  . TONSILLECTOMY  age 53  . TOTAL ABDOMINAL HYSTERECTOMY W/ BILATERAL SALPINGOOPHORECTOMY  1992    Social History April Houston  reports that she has never smoked. She has never used smokeless tobacco. She reports previous alcohol use. She reports that she does not use drugs.  family history includes Cystic fibrosis in an other family member; Diabetes in her mother; Hyperlipidemia in her mother; Hypertension in her mother; Irritable bowel syndrome in her mother, sister, and sister; Kidney cancer in her mother; Lung cancer in her father; Stroke in her mother.  Allergies  Allergen Reactions  . Hydrocodone Itching    severe  . Other Other (See Comments)    Severe headaches w/ most gerd medication IV and PO   . Zantac [Ranitidine Hcl] Other (See Comments)    headaches       PHYSICAL EXAMINATION: Vital signs: BP 94/62   Pulse 88   Ht 5\' 4"  (1.626 m)   Wt 153 lb (69.4 kg)   BMI 26.26 kg/m   Constitutional: generally well-appearing, no acute  distress Psychiatric: alert and oriented x3, cooperative Eyes: extraocular movements intact, anicteric, conjunctiva pink Mouth: oral pharynx moist, no lesions.  No thrush Neck: supple no lymphadenopathy Cardiovascular: heart regular rate and rhythm, no murmur Lungs: clear to auscultation bilaterally Abdomen: soft, nontender, nondistended, no obvious ascites, no peritoneal signs, normal bowel sounds, no organomegaly.  No succussion splash Rectal: Omitted Extremities: no clubbing, cyanosis, or lower extremity edema  bilaterally Skin: no lesions on visible extremities Neuro: No focal deficits.  Cranial nerves intact  ASSESSMENT:  1.  Severe GERD despite pantoprazole 40 mg twice daily.  History of fundoplication with redo fundoplication.  Evidence of slipped wrap and gastroparesis on last exam. 2.  Gastroparesis 3.  History of antral ulcer disease secondary to NSAIDs 4.  Colonoscopy elsewhere 2015 5.  Mild dysphasia  PLAN:  1.  Change from pantoprazole to Dexilant 60 mg twice daily 2.  Schedule upper endoscopy with possible esophageal dilation.The nature of the procedure, as well as the risks, benefits, and alternatives were carefully and thoroughly reviewed with the patient. Ample time for discussion and questions allowed. The patient understood, was satisfied, and agreed to proceed. 3.  Reflux precautions.  Reviewed 4.  May consider surgical opinion for third fundoplication if medical therapy not successful.  She did quite well for years after her first redo fundoplication.  However, this would be technically more difficult with additional risk.  I explained this to her today

## 2018-10-28 NOTE — Patient Instructions (Signed)
We have sent the following medications to your pharmacy for you to pick up at your convenience:  [facility] have been scheduled for an endoscopy. Please follow written instructions given to you at your visit today. If you use inhalers (even only as needed), please bring them with you on the day of your procedure. Your physician has requested that you go to www.[email or web address].[email or web address] and enter the access code given to you at your visit today. This web site gives a general overview about your procedure. However, you should still follow specific instructions given to you by our office regarding your preparation for the procedure.

## 2018-10-31 ENCOUNTER — Telehealth: Payer: Self-pay | Admitting: Internal Medicine

## 2018-10-31 ENCOUNTER — Encounter: Payer: Self-pay | Admitting: Internal Medicine

## 2018-10-31 ENCOUNTER — Ambulatory Visit (AMBULATORY_SURGERY_CENTER): Payer: 59 | Admitting: Internal Medicine

## 2018-10-31 VITALS — BP 103/66 | HR 71 | Temp 98.2°F | Resp 11 | Ht 64.0 in | Wt 153.0 lb

## 2018-10-31 DIAGNOSIS — K219 Gastro-esophageal reflux disease without esophagitis: Secondary | ICD-10-CM | POA: Diagnosis present

## 2018-10-31 DIAGNOSIS — K3184 Gastroparesis: Secondary | ICD-10-CM | POA: Diagnosis not present

## 2018-10-31 DIAGNOSIS — R4702 Dysphasia: Secondary | ICD-10-CM

## 2018-10-31 MED ORDER — SODIUM CHLORIDE 0.9 % IV SOLN: 500.0000 mL | Freq: Once | INTRAVENOUS | Status: DC

## 2018-10-31 NOTE — Telephone Encounter (Signed)
Pt call in and stated she was advised that precert was needed for medication

## 2018-10-31 NOTE — Op Note (Signed)
[facility] Patient Name: April Houston Procedure Date: 10/31/2018 9:13 AM MRN: [id number] Endoscopist: Docia Chuck. Henrene Pastor , MD Age: 73 Referring MD:  Date of Birth: 04/01/1946 Gender: Female Account #: [id number] Procedure:                Upper GI endoscopy Indications:              Esophageal reflux, Nausea, Weight loss Medicines:                Monitored Anesthesia Care Procedure:                Pre-Anesthesia Assessment:                           - Prior to the procedure, a History and Physical                            was performed, and patient medications and                            allergies were reviewed. The patient's tolerance of                            previous anesthesia was also reviewed. The risks                            and benefits of the procedure and the sedation                            options and risks were discussed with the patient.                            All questions were answered, and informed consent                            was obtained. Prior Anticoagulants: The patient has                            taken no previous anticoagulant or antiplatelet                            agents. ASA Grade Assessment: II - A patient with                            mild systemic disease. After reviewing the risks                            and benefits, the patient was deemed in                            satisfactory condition to undergo the procedure.                           After obtaining informed consent, the endoscope was  passed under direct vision. Throughout the                            procedure, the patient's blood pressure, pulse, and                            oxygen saturations were monitored continuously. The                            Endoscope was introduced through the mouth, and                            advanced to the second part of duodenum. The upper                            GI endoscopy was  accomplished without difficulty.                            The patient tolerated the procedure well. Scope In: Scope Out: Findings:                 The esophagus was normal.                           The stomach revealed some retained gastric                            contents, evidence of prior fundoplication, and                            scarring and erythema in the antrum.                           The examined duodenum was normal.                           The cardia and gastric fundus were normal on                            retroflexion. Complications:            No immediate complications. Estimated Blood Loss:     Estimated blood loss: none. Impression:               1. Status post fundoplication                           2. Retained gastric contents consistent with                            gastroparesis                           3. Scarring erythema in the area of previous                            ulceration. No active ulceration or pyloric  stenosis. Recommendation:           - Patient has a contact number available for                            emergencies. The signs and symptoms of potential                            delayed complications were discussed with the                            patient. Return to normal activities tomorrow.                            Written discharge instructions were provided to the                            patient.                           - Reflux precautions.                           - Continue present medications.                           - Recommend smaller portion meals more frequently                           - Office follow-up with Dr. Henrene Pastor in about 8 weeks Docia Chuck. Henrene Pastor, MD 10/31/2018 9:30:37 AM This report has been signed electronically.

## 2018-10-31 NOTE — Progress Notes (Signed)
PT taken to PACU. Monitors in place. VSS. Report given to RN. 

## 2018-10-31 NOTE — Progress Notes (Signed)
Pt's states no medical or surgical changes since previsit or office visit. 

## 2018-10-31 NOTE — Telephone Encounter (Signed)
Submitted prior authorization for twice a day Dexilant through [employer].  Awaiting response.

## 2018-10-31 NOTE — Patient Instructions (Signed)
Impression/Recommendations:  Reflux precautions.   GERD handout given to patient.  Continue present medications.  Recommend smaller portion meals more frequently.  Office follow-up with Dr. Henrene Pastor in about 8 weeks.  YOU HAD AN ENDOSCOPIC PROCEDURE TODAY AT [facility]:   Refer to the procedure report that was given to you for any specific questions about what was found during the examination.  If the procedure report does not answer your questions, please call your gastroenterologist to clarify.  If you requested that your care partner not be given the details of your procedure findings, then the procedure report has been included in a sealed envelope for you to review at your convenience later.  YOU SHOULD EXPECT: Some feelings of bloating in the abdomen. Passage of more gas than usual.  Walking can help get rid of the air that was put into your GI tract during the procedure and reduce the bloating. If you had a lower endoscopy (such as a colonoscopy or flexible sigmoidoscopy) you may notice spotting of blood in your stool or on the toilet paper. If you underwent a bowel prep for your procedure, you may not have a normal bowel movement for a few days.  Please Note:  You might notice some irritation and congestion in your nose or some drainage.  This is from the oxygen used during your procedure.  There is no need for concern and it should clear up in a day or so.  SYMPTOMS TO REPORT IMMEDIATELY:  Following upper endoscopy (EGD)  Vomiting of blood or coffee ground material  New chest pain or pain under the shoulder blades  Painful or persistently difficult swallowing  New shortness of breath  Fever of 100F or higher  Black, tarry-looking stools  For urgent or emergent issues, a gastroenterologist can be reached at any hour by calling [phone].   DIET:  We do recommend a small meal at first, but then you may proceed to your regular diet.  Drink plenty of fluids but  you should avoid alcoholic beverages for 24 hours.  ACTIVITY:  You should plan to take it easy for the rest of today and you should NOT DRIVE or use heavy machinery until tomorrow (because of the sedation medicines used during the test).    FOLLOW UP: Our staff will call the number listed on your records the next business day following your procedure to check on you and address any questions or concerns that you may have regarding the information given to you following your procedure. If we do not reach you, we will leave a message.  However, if you are feeling well and you are not experiencing any problems, there is no need to return our call.  We will assume that you have returned to your regular daily activities without incident.  If any biopsies were taken you will be contacted by phone or by letter within the next 1-3 weeks.  Please call us at [phone] if you have not heard about the biopsies in 3 weeks.    SIGNATURES/CONFIDENTIALITY: You and/or your care partner have signed paperwork which will be entered into your electronic medical record.  These signatures attest to the fact that that the information above on your After Visit Summary has been reviewed and is understood.  Full responsibility of the confidentiality of this discharge information lies with you and/or your care-partner.

## 2018-11-01 ENCOUNTER — Telehealth: Payer: Self-pay | Admitting: *Deleted

## 2018-11-01 NOTE — Telephone Encounter (Signed)
  Follow up Call-  Call back number 10/31/2018 01/31/2018 12/06/2017  Post procedure Call Back phone  # [phone]  Permission to leave phone message Yes Yes Yes  Some recent data might be hidden     Patient questions:  Do you have a fever, pain , or abdominal swelling? No. Pain Score  0 *  Have you tolerated food without any problems? Yes.    Have you been able to return to your normal activities? Yes.    Do you have any questions about your discharge instructions: Diet   No. Medications  No. Follow up visit  No.  Do you have questions or concerns about your Care? No.  Actions: * If pain score is 4 or above: No action needed, pain <4.

## 2018-11-01 NOTE — Telephone Encounter (Signed)
First follow up call attempt.  Phone rang then went to a busy signal. Unable to leave a messagel

## 2018-11-02 NOTE — Telephone Encounter (Signed)
Lm on vm to let patient know that her Dexilant bid prior authorization had been approved.

## 2018-11-16 NOTE — Progress Notes (Signed)
73 y.o. G0P0000 Divorced [place] or Caucasian Not Hispanic or Latino female here for annual exam.   H/o TAH/BSO. 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 Not very irritated currently.  She is struggling with depression, doing better.  She c/o urinary urgency, no leakage. Not completely emptying her bladder, needs to give it time to empty, needs to double void. Ultimately feels empty. She does dribble some urine after voiding.  She takes fiber, normal BM    No LMP recorded. Patient has had a hysterectomy.          Sexually active: No.  The current method of family planning is status post hysterectomy.    Exercising: Yes.    walking the dog daily Smoker:  no  Health Maintenance: Pap:  Unsure- years ago History of abnormal Pap:  no MMG:  11/03/2016 WNL  Colonoscopy:  2017 polyp - repeat in 10 yrs BMD:   11/02/2016  Osteopenia  TDaP:  2015 Gardasil: N/A   reports that she has never smoked. She has never used smokeless tobacco. She reports previous alcohol use. She reports that she does not use drugs. She is an Surveyor, minerals, works for the city of [place]. Lives with her sister, she is disabled with mental illness. She feels responsible for her, it is difficult. She has dog that she adores. Has 5 cats (doesn't want that many).  Past Medical History:  Diagnosis Date  . Anal fissure   . Anal pain   . Anemia   . Anxiety   . Blood transfusion without reported diagnosis 2002   After Nissen Fundiplication  . Cataract   . Chronic headaches   . Depression   . Endometriosis   . GERD (gastroesophageal reflux disease)   . Hiatal hernia   . Hypertension    no meds/ in past  . IBS (irritable bowel syndrome)   . Lichen sclerosus   . Pancreatitis   . Panic attacks   . Peptic ulcer   . PVC (premature ventricular contraction)   . SOB (shortness of breath)    with panic attack and anxiety  . Wears glasses     Past Surgical History:  Procedure Laterality Date  .  ABDOMINAL SURGERY    . CATARACT EXTRACTION, BILATERAL     Bil/ In 2018  . LAPAROSCOPIC NISSEN FUNDOPLICATION  7106   and Repair Hiatal Hernia  . LIPOMA EXCISION    . RECTAL EXAM UNDER ANESTHESIA N/A 12/06/2015   Procedure: ANAL  EXAM UNDER ANESTHESIA, BIOPSY;  Surgeon: Leighton Ruff, MD;  Location: [facility];  Service: General;  Laterality: N/A;  . TONSILLECTOMY  age 54  . TOTAL ABDOMINAL HYSTERECTOMY W/ BILATERAL SALPINGOOPHORECTOMY  1992    Current Outpatient Medications  Medication Sig Dispense Refill  . ALPRAZolam (XANAX) 1 MG tablet Take 1 tab 3 times a day    . calcium carbonate (TUMS - DOSED IN MG ELEMENTAL CALCIUM) 500 MG chewable tablet Chew 1 tablet by mouth daily.    Marland Kitchen Desvenlafaxine Succinate (PRISTIQ PO) Take 1 tablet by mouth daily.     Marland Kitchen dexlansoprazole (DEXILANT) 60 MG capsule Take 1 capsule (60 mg total) by mouth 2 (two) times daily. 180 capsule 3  . docusate sodium (STOOL SOFTENER) 100 MG capsule Take 100 mg by mouth daily.    . mirtazapine (REMERON) 15 MG tablet Take 15 mg by mouth 2 (two) times daily.    Marland Kitchen OVER THE COUNTER MEDICATION OTC EMUAID MAX- apply to vaginal and  rectal area PRN    . pantoprazole (PROTONIX) 40 MG tablet Take 1 tablet (40 mg total) by mouth 2 (two) times daily. 60 tablet 11  . polyethylene glycol powder (GLYCOLAX/MIRALAX) powder Take 17 g by mouth 2 (two) times daily. 255 g 0  . psyllium (METAMUCIL) 58.6 % powder Take 1 packet by mouth daily.    . mirtazapine (REMERON) 30 MG tablet Take 30 mg by mouth daily.      Current Facility-Administered Medications  Medication Dose Route Frequency Provider Last Rate Last Dose  . 0.9 %  sodium chloride infusion  500 mL Intravenous Once Irene Shipper, MD        Family History  Problem Relation Age of Onset  . Hypertension Mother   . Hyperlipidemia Mother   . Stroke Mother   . Diabetes Mother   . Irritable bowel syndrome Mother   . Kidney cancer Mother   . Lung cancer Father   .  Irritable bowel syndrome Sister   . Cystic fibrosis Other   . Irritable bowel syndrome Sister   . Esophageal cancer Neg Hx   . Stomach cancer Neg Hx     Review of Systems  Constitutional: Negative.   HENT: Negative.   Eyes: Negative.   Respiratory: Negative.   Cardiovascular: Negative.   Gastrointestinal: Negative.   Endocrine: Negative.   Genitourinary: Positive for frequency and urgency.       Pain on outside of vagina during urination Night urination  Musculoskeletal: Negative.   Skin: Negative.   Allergic/Immunologic: Negative.   Neurological: Positive for headaches.  Hematological: Negative.   Psychiatric/Behavioral: Negative.     Exam:   BP 110/70 (BP Location: Right Arm, Patient Position: Sitting, Cuff Size: Normal)   Pulse 76   Resp 16   Ht 5' 4.25" (1.632 m)   Wt 151 lb (68.5 kg)   BMI 25.72 kg/m   Weight change: @WEIGHTCHANGE @ Height:   Height: 5' 4.25" (163.2 cm)  Ht Readings from Last 3 Encounters:  11/17/18 5' 4.25" (1.632 m)  10/31/18 5\' 4"  (1.626 m)  10/28/18 5\' 4"  (1.626 m)    General appearance: alert, cooperative and appears stated age Head: Normocephalic, without obvious abnormality, atraumatic Neck: no adenopathy, supple, symmetrical, trachea midline and thyroid normal to inspection and palpation Lungs: clear to auscultation bilaterally Cardiovascular: regular rate and rhythm Breasts: normal appearance, no masses or tenderness Abdomen: soft, non-tender; non distended,  no masses,  no organomegaly Extremities: extremities normal, atraumatic, no cyanosis or edema Skin: Skin color, texture, turgor normal. No rashes or lesions Lymph nodes: Cervical, supraclavicular, and axillary nodes normal. No abnormal inguinal nodes palpated Neurologic: Grossly normal Erythematous rash in left groin, with fissure, c/w candida   Pelvic: External genitalia:  no lesions, mild loss of architecture, no significant whitening              Urethra:  normal appearing  urethra with no masses, tenderness or lesions              Bartholins and Skenes: normal                 Vagina: normal appearing vagina with normal color and discharge, no lesions              Cervix: no lesions               Bimanual Exam:  Uterus:  normal size, contour, position, consistency, mobility, non-tender  Adnexa: no mass, fullness, tenderness               Rectovaginal: Confirms               Anus:  normal sphincter tone, no lesions  Chaperone was present for exam.  A:  Well Woman with normal exam  Lichen sclerosis  Depression, managed by Psychiatry  Candida intertrigo  P:   Labs with primary  Mammogram due  DEXA with primary  Discussed breast self exam  Discussed calcium and vit D intake  No paps needed  Nystatin

## 2018-11-17 ENCOUNTER — Other Ambulatory Visit: Payer: Self-pay

## 2018-11-17 ENCOUNTER — Encounter: Payer: Self-pay | Admitting: Obstetrics and Gynecology

## 2018-11-17 ENCOUNTER — Ambulatory Visit: Payer: 59 | Admitting: Obstetrics and Gynecology

## 2018-11-17 VITALS — BP 110/70 | HR 76 | Resp 16 | Ht 64.25 in | Wt 151.0 lb

## 2018-11-17 DIAGNOSIS — Z01419 Encounter for gynecological examination (general) (routine) without abnormal findings: Secondary | ICD-10-CM

## 2018-11-17 DIAGNOSIS — L900 Lichen sclerosus et atrophicus: Secondary | ICD-10-CM | POA: Diagnosis not present

## 2018-11-17 DIAGNOSIS — R3915 Urgency of urination: Secondary | ICD-10-CM

## 2018-11-17 DIAGNOSIS — N952 Postmenopausal atrophic vaginitis: Secondary | ICD-10-CM

## 2018-11-17 DIAGNOSIS — B372 Candidiasis of skin and nail: Secondary | ICD-10-CM

## 2018-11-17 DIAGNOSIS — R350 Frequency of micturition: Secondary | ICD-10-CM | POA: Diagnosis not present

## 2018-11-17 LAB — POCT URINALYSIS DIPSTICK
BILIRUBIN UA: NEGATIVE
Blood, UA: POSITIVE
Glucose, UA: NEGATIVE
Ketones, UA: NEGATIVE
Nitrite, UA: NEGATIVE
Protein, UA: NEGATIVE
Spec Grav, UA: 1.01 (ref 1.010–1.025)
Urobilinogen, UA: 0.2 E.U./dL
pH, UA: 5 (ref 5.0–8.0)

## 2018-11-17 MED ORDER — NYSTATIN 100000 UNIT/GM EX CREA: 1.0000 "application " | TOPICAL_CREAM | Freq: Two times a day (BID) | CUTANEOUS | 0 refills | Status: DC

## 2018-11-17 NOTE — Patient Instructions (Signed)
EXERCISE AND DIET:  We recommended that you start or continue a regular exercise program for good health. Regular exercise means any activity that makes your heart beat faster and makes you sweat.  We recommend exercising at least 30 minutes per day at least 3 days a week, preferably 4 or 5.  We also recommend a diet low in fat and sugar.  Inactivity, poor dietary choices and obesity can cause diabetes, heart attack, stroke, and kidney damage, among others.    ALCOHOL AND SMOKING:  Women should limit their alcohol intake to no more than 7 drinks/beers/glasses of wine (combined, not each!) per week. Moderation of alcohol intake to this level decreases your risk of breast cancer and liver damage. And of course, no recreational drugs are part of a healthy lifestyle.  And absolutely no smoking or even second hand smoke. Most people know smoking can cause heart and lung diseases, but did you know it also contributes to weakening of your bones? Aging of your skin?  Yellowing of your teeth and nails?  CALCIUM AND VITAMIN D:  Adequate intake of calcium and Vitamin D are recommended.  The recommendations for exact amounts of these supplements seem to change often, but generally speaking 1,000 mg of calcium (between diet and supplement) and 800 units of Vitamin D per day seems prudent. Certain women may benefit from higher intake of Vitamin D.  If you are among these women, your doctor will have told you during your visit.    PAP SMEARS:  Pap smears, to check for cervical cancer or precancers,  have traditionally been done yearly, although recent scientific advances have shown that most women can have pap smears less often.  However, every woman still should have a physical exam from her gynecologist every year. It will include a breast check, inspection of the vulva and vagina to check for abnormal growths or skin changes, a visual exam of the cervix, and then an exam to evaluate the size and shape of the uterus and  ovaries.  And after 71 years of age, a rectal exam is indicated to check for rectal cancers. We will also provide age appropriate advice regarding health maintenance, like when you should have certain vaccines, screening for sexually transmitted diseases, bone density testing, colonoscopy, mammograms, etc.   MAMMOGRAMS:  All women over 40 years old should have a yearly mammogram. Many facilities now offer a "3D" mammogram, which may cost around $50 extra out of pocket. If possible,  we recommend you accept the option to have the 3D mammogram performed.  It both reduces the number of women who will be called back for extra views which then turn out to be normal, and it is better than the routine mammogram at detecting truly abnormal areas.    COLON CANCER SCREENING: Now recommend starting at age 45. At this time colonoscopy is not covered for routine screening until 50. There are take home tests that can be done between 45-49.   COLONOSCOPY:  Colonoscopy to screen for colon cancer is recommended for all women at age 50.  We know, you hate the idea of the prep.  We agree, BUT, having colon cancer and not knowing it is worse!!  Colon cancer so often starts as a polyp that can be seen and removed at colonscopy, which can quite literally save your life!  And if your first colonoscopy is normal and you have no family history of colon cancer, most women don't have to have it again for   10 years.  Once every ten years, you can do something that may end up saving your life, right?  We will be happy to help you get it scheduled when you are ready.  Be sure to check your insurance coverage so you understand how much it will cost.  It may be covered as a preventative service at no cost, but you should check your particular policy.      Breast Self-Awareness Breast self-awareness means being familiar with how your breasts look and feel. It involves checking your breasts regularly and reporting any changes to your  health care provider. Practicing breast self-awareness is important. A change in your breasts can be a sign of a serious medical problem. Being familiar with how your breasts look and feel allows you to find any problems early, when treatment is more likely to be successful. All women should practice breast self-awareness, including women who have had breast implants. How to do a breast self-exam One way to learn what is normal for your breasts and whether your breasts are changing is to do a breast self-exam. To do a breast self-exam: Look for Changes  1. Remove all the clothing above your waist. 2. Stand in front of a mirror in a room with good lighting. 3. Put your hands on your hips. 4. Push your hands firmly downward. 5. Compare your breasts in the mirror. Look for differences between them (asymmetry), such as: ? Differences in shape. ? Differences in size. ? Puckers, dips, and bumps in one breast and not the other. 6. Look at each breast for changes in your skin, such as: ? Redness. ? Scaly areas. 7. Look for changes in your nipples, such as: ? Discharge. ? Bleeding. ? Dimpling. ? Redness. ? A change in position. Feel for Changes Carefully feel your breasts for lumps and changes. It is best to do this while lying on your back on the floor and again while sitting or standing in the shower or tub with soapy water on your skin. Feel each breast in the following way:  Place the arm on the side of the breast you are examining above your head.  Feel your breast with the other hand.  Start in the nipple area and make  inch (2 cm) overlapping circles to feel your breast. Use the pads of your three middle fingers to do this. Apply light pressure, then medium pressure, then firm pressure. The light pressure will allow you to feel the tissue closest to the skin. The medium pressure will allow you to feel the tissue that is a little deeper. The firm pressure will allow you to feel the tissue  close to the ribs.  Continue the overlapping circles, moving downward over the breast until you feel your ribs below your breast.  Move one finger-width toward the center of the body. Continue to use the  inch (2 cm) overlapping circles to feel your breast as you move slowly up toward your collarbone.  Continue the up and down exam using all three pressures until you reach your armpit.  Write Down What You Find  Write down what is normal for each breast and any changes that you find. Keep a written record with breast changes or normal findings for each breast. By writing this information down, you do not need to depend only on memory for size, tenderness, or location. Write down where you are in your menstrual cycle, if you are still menstruating. If you are having trouble noticing differences   in your breasts, do not get discouraged. With time you will become more familiar with the variations in your breasts and more comfortable with the exam. How often should I examine my breasts? Examine your breasts every month. If you are breastfeeding, the best time to examine your breasts is after a feeding or after using a breast pump. If you menstruate, the best time to examine your breasts is 5-7 days after your period is over. During your period, your breasts are lumpier, and it may be more difficult to notice changes. When should I see my health care provider? See your health care provider if you notice:  A change in shape or size of your breasts or nipples.  A change in the skin of your breast or nipples, such as a reddened or scaly area.  Unusual discharge from your nipples.  A lump or thick area that was not there before.  Pain in your breasts.  Anything that concerns you.  

## 2018-11-18 LAB — URINE CULTURE: Organism ID, Bacteria: NO GROWTH

## 2018-11-18 LAB — URINALYSIS, MICROSCOPIC ONLY: Bacteria, UA: NONE SEEN

## 2019-02-01 ENCOUNTER — Telehealth: Payer: Self-pay | Admitting: Internal Medicine

## 2019-02-02 ENCOUNTER — Telehealth: Payer: Self-pay

## 2019-02-02 NOTE — Telephone Encounter (Signed)
April Houston pt calling stating the Dexilant is not helping her reflux. States [name] told her the [place] may cause her to develop yeast infections. She saw her urogyn and was told she does have yeast in her vagina, rectal area, and groin. 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 Pt calling wanting something different for reflux. Please advise as DOD.

## 2019-02-02 NOTE — Telephone Encounter (Signed)
Please see note below from pt and disregard previous message.

## 2019-02-02 NOTE — Telephone Encounter (Signed)
Pt called back and said to disregard the previous message until she is able to research all of her meds and see if they are causing the yeast infections

## 2019-02-02 NOTE — Telephone Encounter (Signed)
April Houston pt calling stating the Dexilant is not helping her reflux. States [name] told her [name] may cause her to develop yeast infections. She saw her urogyn and was told she does have yeast in her vagina, rectal area, and groin. 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 Pt calling wanting something different for reflux. Please advise as DOD.

## 2019-02-02 NOTE — Telephone Encounter (Signed)
Reading through Dr. Blanch Media notes she previously was on pantoprazole at 1.40 mg once daily and then had to be increased to 40 mg twice daily on her own occasion because of progressive symptoms.  This progressive symptoms led to an office visit and subsequent transition of Protonix to [place].  She underwent an upper endoscopy. Although PPIs have been associated with the possibility of developing yeast it more often yeast in the esophagus that we end up seeing PPIs being or causing a patient be at risk of having development of.  It is not clear to me that a second PPI switch is going to be any more effective for this patient who was being considered for a possible repeat surgical evaluation for another redo fundoplication but as her acid symptoms seem to be significant per Dr. Blanch Media notes I think we can transition her to Nexium.  Let us transition her to Nexium 40 mg twice daily and see how she does with a follow-up with Dr. Henrene Pastor versus a follow-up call in approximately 2 weeks to see if he has had any improvement.  Please let her know as well that if not clear that this will make a significant difference for her but it is worth trying.  If she does not want to transition PPIs again then I would have her trial Pepcid 40 mg twice daily, but as significant as her symptoms seem to be I think the PPI is going to be the most effective if it will be effective for her.  Thank you for reaching out.  GM

## 2019-02-03 MED ORDER — ESOMEPRAZOLE MAGNESIUM 40 MG PO CPDR: 40.0000 mg | DELAYED_RELEASE_CAPSULE | Freq: Two times a day (BID) | ORAL | 3 refills | Status: DC

## 2019-02-03 NOTE — Telephone Encounter (Signed)
Spoke with pt and she is aware. Script sent to pharmacy. 

## 2019-02-09 ENCOUNTER — Telehealth: Payer: Self-pay | Admitting: Gastroenterology

## 2019-02-09 NOTE — Telephone Encounter (Signed)
The patient called said that the med esomeprazole (NEXIUM) 40 MG capsule is not helping she is getting a lot of head aches.

## 2019-02-09 NOTE — Telephone Encounter (Signed)
Pt aware and scheduled for telemed visit with Dr. Henrene Pastor 02/15/19@11am .

## 2019-02-09 NOTE — Telephone Encounter (Signed)
She needs to schedule a telemedicine visit to discuss at this point

## 2019-02-09 NOTE — Telephone Encounter (Signed)
Pt called last week stating she thought the [place] she had been prescribed caused her to have yeast around her bottom and vaginal yeast. See note below from Dr. Rush Landmark:  Note    Reading through Dr. Blanch Media notes she previously was on pantoprazole at 1.40 mg once daily and then had to be increased to 40 mg twice daily on her own occasion because of progressive symptoms.  This progressive symptoms led to an office visit and subsequent transition of Protonix to [place].  She underwent an upper endoscopy. Although PPIs have been associated with the possibility of developing yeast it more often yeast in the esophagus that we end up seeing PPIs being or causing a patient be at risk of having development of.  It is not clear to me that a second PPI switch is going to be any more effective for this patient who was being considered for a possible repeat surgical evaluation for another redo fundoplication but as her acid symptoms seem to be significant per Dr. Blanch Media notes I think we can transition her to Nexium.  Let us transition her to Nexium 40 mg twice daily and see how she does with a follow-up with Dr. Henrene Pastor versus a follow-up call in approximately 2 weeks to see if he has had any improvement.  Please let her know as well that if not clear that this will make a significant difference for her but it is worth trying.  If she does not want to transition PPIs again then I would have her trial Pepcid 40 mg twice daily, but as significant as her symptoms seem to be I think the PPI is going to be the most effective if it will be effective for her.  Thank you for reaching out.  GM     Pt is now calling back stating the nexium is not helping her reflux symptoms. Please advise.

## 2019-02-15 ENCOUNTER — Ambulatory Visit (INDEPENDENT_AMBULATORY_CARE_PROVIDER_SITE_OTHER): Payer: 59 | Admitting: Internal Medicine

## 2019-02-15 ENCOUNTER — Encounter: Payer: Self-pay | Admitting: Internal Medicine

## 2019-02-15 ENCOUNTER — Other Ambulatory Visit: Payer: Self-pay

## 2019-02-15 VITALS — Ht 64.0 in | Wt 146.0 lb

## 2019-02-15 DIAGNOSIS — K219 Gastro-esophageal reflux disease without esophagitis: Secondary | ICD-10-CM

## 2019-02-15 DIAGNOSIS — K3184 Gastroparesis: Secondary | ICD-10-CM

## 2019-02-15 MED ORDER — FAMOTIDINE 20 MG PO TABS: 20.0000 mg | ORAL_TABLET | Freq: Two times a day (BID) | ORAL | 11 refills | Status: DC

## 2019-02-15 NOTE — Addendum Note (Signed)
Addended by: Wyline Beady on: 02/15/2019 01:07 PM   Modules accepted: Orders

## 2019-02-15 NOTE — Progress Notes (Signed)
HISTORY OF PRESENT ILLNESS:  April Houston is a 73 y.o. female who presents today for telemedicine follow-up regarding her GERD and ongoing problems with the same.  I saw the patient in the office October 28, 2018 at which time she was experiencing severe GERD despite pantoprazole 40 mg twice daily.  She is known to have gastroparesis and history of redo fundoplication.  As well NSAID induced ulcer disease.  Her PPI was changed to Dexilant 60 mg twice daily.  Upper endoscopy was performed October 31, 2018 and revealed evidence of prior fundoplication as well as retained gastric contents consistent with gastroparesis.  No evidence of active ulcer disease or gastric outlet obstruction.  She was advised with regards to reflux precautions and told to follow-up in about 8 weeks.  She contacted the office complaining of Dexilant related headaches.  She was changed to Nexium 40 mg twice daily.  Despite this she continues with reflux symptoms.  She also continues with headaches which she believes are secondary to her PPI.  She wishes to switch to H2 receptor antagonist therapy which she found helpful in the past.  Other symptoms including mild dysphasia with nocturnal pyrosis and regurgitation continue  REVIEW OF SYSTEMS:  All non-GI ROS negative unless otherwise stated in the HPI except for depression, headaches,  Past Medical History:  Diagnosis Date  . Anal fissure   . Anal pain   . Anemia   . Anxiety   . Blood transfusion without reported diagnosis 2002   After Nissen Fundiplication  . Cataract   . Chronic headaches   . Depression   . Endometriosis   . GERD (gastroesophageal reflux disease)   . Hiatal hernia   . Hypertension    no meds/ in past  . IBS (irritable bowel syndrome)   . Lichen sclerosus   . Pancreatitis   . Panic attacks   . Peptic ulcer   . PVC (premature ventricular contraction)   . SOB (shortness of breath)    with panic attack and anxiety  . Wears glasses     Past  Surgical History:  Procedure Laterality Date  . ABDOMINAL SURGERY    . CATARACT EXTRACTION, BILATERAL     Bil/ In 2018  . LAPAROSCOPIC NISSEN FUNDOPLICATION  1829   and Repair Hiatal Hernia  . LIPOMA EXCISION    . RECTAL EXAM UNDER ANESTHESIA N/A 12/06/2015   Procedure: ANAL  EXAM UNDER ANESTHESIA, BIOPSY;  Surgeon: Leighton Ruff, MD;  Location: [facility];  Service: General;  Laterality: N/A;  . TONSILLECTOMY  age 87  . TOTAL ABDOMINAL HYSTERECTOMY W/ BILATERAL SALPINGOOPHORECTOMY  1992    Social History April Houston  reports that she has never smoked. She has never used smokeless tobacco. She reports previous alcohol use. She reports that she does not use drugs.  family history includes Cystic fibrosis in an other family member; Diabetes in her mother; Hyperlipidemia in her mother; Hypertension in her mother; Irritable bowel syndrome in her mother, sister, and sister; Kidney cancer in her mother; Lung cancer in her father; Stroke in her mother.  Allergies  Allergen Reactions  . Hydrocodone Itching    severe  . Other Other (See Comments)    Severe headaches w/ most gerd medication IV and PO   . Zantac [Ranitidine Hcl] Other (See Comments)    headaches       PHYSICAL EXAMINATION: No physical exam with telemedicine visit   ASSESSMENT:  1.  GERD with prior history of  redo fundoplication.  Active symptoms on twice daily PPI 2.  Gastroparesis.  Present on recent endoscopy 3.  History of NSAID related ulcer disease.  None on recent endoscopy 4.  Headaches.  Patient believes they are PPI related 5.  Colonoscopy elsewhere 2015  PLAN:  1.  Reflux precautions 2.  Discontinue Nexium 3.  PRESCRIBE FAMOTIDINE (PEPCID) 40 mg TWICE DAILY; #60; 11 refills.  I discussed with her the actions and risks of the medication 4.  Office follow-up in 2 months recommended.  She agrees This telemedicine visit was audio only (Medicare patient) and initiated by the patient as  well as consented for by the patient.  She was in her home and I was in my office during the encounter.  She understands her may be an associated professional charge with this service

## 2019-02-15 NOTE — Patient Instructions (Signed)
Patient advised to avoid spicy, acidic, citrus, chocolate, mints, fruit and fruit juices.  Limit the intake of caffeine, alcohol and Soda.  Don't exercise too soon after eating.  Don't lie down within 3-4 hours of eating.  Elevate the head of your bed.  Stop Nexium.   We have sent the following medications to your pharmacy for you to pick up at your convenience: Famotidine twice daily  Please call the office in 2 months to make a follow up with Dr. Henrene Pastor.

## 2019-02-25 ENCOUNTER — Other Ambulatory Visit: Payer: Self-pay | Admitting: Gastroenterology

## 2019-02-27 ENCOUNTER — Other Ambulatory Visit: Payer: Self-pay | Admitting: Gastroenterology

## 2019-04-14 DIAGNOSIS — R5381 Other malaise: Secondary | ICD-10-CM | POA: Insufficient documentation

## 2019-04-14 DIAGNOSIS — Z13220 Encounter for screening for lipoid disorders: Secondary | ICD-10-CM | POA: Insufficient documentation

## 2019-04-17 NOTE — Progress Notes (Signed)
GYNECOLOGY  VISIT   HPI: 73 y.o.   Divorced [place] or Caucasian Not Hispanic or Latino  female   G0P0000 with No LMP recorded. Patient has had a hysterectomy.   here for vaginal irritation and pain. This began after attempting to have intercourse on Sunday. Unable to complete intercourse secondary to pain. She has a new partner, long term friends, recently sexually active. She had sex for the first time, she feels like she is torn up. She c/o yeast in her groin, around her vagina and rectum. Not itchy, very irritated. She has nystatin, has used it some, helps short term. No vaginal bleeding, no abnormal discharge.     The patient has a h/o TAH/BSO. H/O lichen sclerosis.  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   GYNECOLOGIC HISTORY: No LMP recorded. Patient has had a hysterectomy. Contraception:hysterectomy Menopausal hormone therapy: None        OB History    Gravida  0   Para  0   Term  0   Preterm  0   AB  0   Living  0     SAB  0   TAB  0   Ectopic  0   Multiple  0   Live Births  0              There are no active problems to display for this patient.   Past Medical History:  Diagnosis Date  . Anal fissure   . Anal pain   . Anemia   . Anxiety   . Blood transfusion without reported diagnosis 2002   After Nissen Fundiplication  . Cataract   . Chronic headaches   . Depression   . Endometriosis   . GERD (gastroesophageal reflux disease)   . Hiatal hernia   . Hypertension    no meds/ in past  . IBS (irritable bowel syndrome)   . Lichen sclerosus   . Pancreatitis   . Panic attacks   . Peptic ulcer   . PVC (premature ventricular contraction)   . SOB (shortness of breath)    with panic attack and anxiety  . Wears glasses     Past Surgical History:  Procedure Laterality Date  . ABDOMINAL SURGERY    . CATARACT EXTRACTION, BILATERAL     Bil/ In 2018  . LAPAROSCOPIC NISSEN FUNDOPLICATION  6440   and Repair  Hiatal Hernia  . LIPOMA EXCISION    . RECTAL EXAM UNDER ANESTHESIA N/A 12/06/2015   Procedure: ANAL  EXAM UNDER ANESTHESIA, BIOPSY;  Surgeon: Leighton Ruff, MD;  Location: [place];  Service: General;  Laterality: N/A;  . TONSILLECTOMY  age 85  . TOTAL ABDOMINAL HYSTERECTOMY W/ BILATERAL SALPINGOOPHORECTOMY  1992    Current Outpatient Medications  Medication Sig Dispense Refill  . ALPRAZolam (XANAX) 1 MG tablet Take 1 tab 3 times a day    . augmented betamethasone dipropionate (DIPROLENE-AF) 0.05 % ointment Apply topically 2 (two) times daily.    . calcium carbonate (TUMS - DOSED IN MG ELEMENTAL CALCIUM) 500 MG chewable tablet Chew 1 tablet by mouth daily.    Marland Kitchen Desvenlafaxine Succinate (PRISTIQ PO) Take 1 tablet by mouth daily.     Marland Kitchen docusate sodium (STOOL SOFTENER) 100 MG capsule Take 100 mg by mouth daily.    . famotidine (PEPCID) 20 MG tablet Take 1 tablet (20 mg total) by mouth 2 (two) times daily. 60 tablet 11  .  hydrOXYzine (ATARAX/VISTARIL) 25 MG tablet Take 1 tab tid for anxiety (dose changed)    . OVER THE COUNTER MEDICATION OTC EMUAID MAX- apply to vaginal and rectal area PRN    . polyethylene glycol powder (GLYCOLAX/MIRALAX) powder Take 17 g by mouth 2 (two) times daily. 255 g 0  . psyllium (METAMUCIL) 58.6 % powder Take 1 packet by mouth daily.    Marland Kitchen topiramate (TOPAMAX) 25 MG tablet Take 1 tab twice a day for a week, then 2 tabs twice a day    . nystatin cream (MYCOSTATIN) Apply 1 application topically daily.     Current Facility-Administered Medications  Medication Dose Route Frequency Provider Last Rate Last Dose  . 0.9 %  sodium chloride infusion  500 mL Intravenous Once Irene Shipper, MD         ALLERGIES: Hydrocodone, Other, and Zantac [ranitidine hcl]  Family History  Problem Relation Age of Onset  . Hypertension Mother   . Hyperlipidemia Mother   . Stroke Mother   . Diabetes Mother   . Irritable bowel syndrome Mother   . Kidney cancer Mother    . Lung cancer Father   . Irritable bowel syndrome Sister   . Cystic fibrosis Other   . Irritable bowel syndrome Sister   . Esophageal cancer Neg Hx   . Stomach cancer Neg Hx     Social History   Socioeconomic History  . Marital status: Divorced    Spouse name: Not on file  . Number of children: 0  . Years of education: Not on file  . Highest education level: Not on file  Occupational History  . 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 Financial resource strain: Not on file  . Food insecurity    Worry: Not on file    Inability: Not on file  . Transportation needs    Medical: Not on file    Non-medical: Not on file  Tobacco Use  . Smoking status: Never Smoker  . Smokeless tobacco: Never Used  Substance and Sexual Activity  . Alcohol use: Not Currently  . Drug use: No  . Sexual activity: Yes    Partners: Male    Birth control/protection: Post-menopausal  Lifestyle  . Physical activity    Days per week: Not on file    Minutes per session: Not on file  . Stress: Not on file  Relationships  . Social Herbalist on phone: Not on file    Gets together: Not on file    Attends religious service: Not on file    Active member of club or organization: Not on file    Attends meetings of clubs or organizations: Not on file    Relationship status: Not on file  . Intimate partner violence    Fear of current or ex partner: Not on file    Emotionally abused: Not on file    Physically abused: Not on file    Forced sexual activity: Not on file  Other Topics Concern  . Not on file  Social History Narrative  . Not on file    Review of Systems  Constitutional: Negative.   HENT: Negative.   Eyes: Negative.   Respiratory: Negative.   Cardiovascular: Negative.   Gastrointestinal: Positive for diarrhea.  Genitourinary:       Vaginal irritation Vaginal pain  Musculoskeletal: Negative.   Skin: Negative.   Neurological: Negative.   Endo/Heme/Allergies: Negative.    Psychiatric/Behavioral: Negative.  PHYSICAL EXAMINATION:    BP 126/84 (BP Location: Right Arm, Patient Position: Sitting, Cuff Size: Normal)   Pulse 76   Temp 98.2 F (36.8 C) (Skin)   Wt 138 lb (62.6 kg)   BMI 23.69 kg/m     General appearance: alert, cooperative and appears stated age Mild candida intertrigo in her groin (she has nystatin)  Pelvic: External genitalia:  no lesions, mild whitening, some fissures, minimal loss of architecture.               Urethra:  normal appearing urethra with no masses, tenderness or lesions              Bartholins and Skenes: normal                 Vagina: atrophic appearing vagina with normal color and discharge, no lesions. Able to insert 2 fingers part way. Fissures noted at the posterior fourchette going in towards the vagina.               Cervix: absent              Bimanual Exam:  Uterus:  uterus absent              Adnexa: no mass, fullness, tenderness              Perianal whitening, fissures, raised lesion at 7 o'clock just outside the anus  Chaperone was present for exam.  ASSESSMENT Lichen sclerosis Vulvitis, recent Dyspareunia Vaginal atrophy Morphea Perianal lesion    PLAN Steroid ointment, can use 2 x a day for up to 2 weeks, then up to 1-2 x a week as needed (not for daily long term use) Start Yuvifem Can use Vaseline externally daily F/U with her dermatologist for possible perianal biopsy (lesion) F/U here in 4 weeks Await at least 2 weeks to attempt intercourse, use a lubricant and she should control rate and depth of penetration Discussed possible vaginal dilators   An After Visit Summary was printed and given to the patient.

## 2019-04-18 ENCOUNTER — Encounter: Payer: Self-pay | Admitting: Obstetrics and Gynecology

## 2019-04-18 ENCOUNTER — Ambulatory Visit: Payer: 59 | Admitting: Obstetrics and Gynecology

## 2019-04-18 ENCOUNTER — Other Ambulatory Visit: Payer: Self-pay

## 2019-04-18 VITALS — BP 126/84 | HR 76 | Temp 98.2°F | Wt 138.0 lb

## 2019-04-18 DIAGNOSIS — N941 Unspecified dyspareunia: Secondary | ICD-10-CM

## 2019-04-18 DIAGNOSIS — K629 Disease of anus and rectum, unspecified: Secondary | ICD-10-CM

## 2019-04-18 DIAGNOSIS — N952 Postmenopausal atrophic vaginitis: Secondary | ICD-10-CM

## 2019-04-18 DIAGNOSIS — L900 Lichen sclerosus et atrophicus: Secondary | ICD-10-CM

## 2019-04-18 DIAGNOSIS — N763 Subacute and chronic vulvitis: Secondary | ICD-10-CM | POA: Diagnosis not present

## 2019-04-18 DIAGNOSIS — L940 Localized scleroderma [morphea]: Secondary | ICD-10-CM | POA: Diagnosis not present

## 2019-04-18 MED ORDER — ESTRADIOL 10 MCG VA TABS: ORAL_TABLET | VAGINAL | 0 refills | Status: DC

## 2019-04-18 MED ORDER — BETAMETHASONE VALERATE 0.1 % EX OINT: 1.0000 "application " | TOPICAL_OINTMENT | Freq: Two times a day (BID) | CUTANEOUS | 0 refills | Status: DC

## 2019-04-18 NOTE — Patient Instructions (Signed)
Follow up with your dermatologist for evaluation of peri-anal lesion

## 2019-04-19 LAB — VAGINITIS/VAGINOSIS, DNA PROBE
Candida Species: NEGATIVE
Gardnerella vaginalis: NEGATIVE
Trichomonas vaginosis: NEGATIVE

## 2019-05-16 ENCOUNTER — Other Ambulatory Visit: Payer: Self-pay

## 2019-05-18 ENCOUNTER — Ambulatory Visit: Payer: 59 | Admitting: Obstetrics and Gynecology

## 2019-05-18 ENCOUNTER — Other Ambulatory Visit: Payer: Self-pay

## 2019-05-18 ENCOUNTER — Encounter: Payer: Self-pay | Admitting: Obstetrics and Gynecology

## 2019-05-18 VITALS — BP 122/82 | HR 80 | Temp 98.2°F | Wt 135.2 lb

## 2019-05-18 DIAGNOSIS — N952 Postmenopausal atrophic vaginitis: Secondary | ICD-10-CM | POA: Diagnosis not present

## 2019-05-18 DIAGNOSIS — L940 Localized scleroderma [morphea]: Secondary | ICD-10-CM | POA: Diagnosis not present

## 2019-05-18 DIAGNOSIS — L900 Lichen sclerosus et atrophicus: Secondary | ICD-10-CM | POA: Diagnosis not present

## 2019-05-18 MED ORDER — ESTRADIOL 10 MCG VA TABS: ORAL_TABLET | VAGINAL | 1 refills | Status: DC

## 2019-05-18 NOTE — Progress Notes (Signed)
GYNECOLOGY  VISIT   HPI: 73 y.o.   Divorced [place] or Caucasian Not Hispanic or Latino  female   G0P0000 with No LMP recorded. Patient has had a hysterectomy.   here for follow up of vaginal atrophy, dyspareunia, vulvitis, lichen sclerosis. Last month she was started on vaginal estrogen and topical steroids.   Also has a h/o morphea and a perianal lesion. She saw the dermatologist, lesion was a nevus.      She is feeling much better, not irritated. She is doing well with the vaginal estrogen, no longer having dyspareunia.   H/O TAH/BSO.  GYNECOLOGIC HISTORY: No LMP recorded. Patient has had a hysterectomy. Contraception: Hysterectomy Menopausal hormone therapy: Estradiol vaginal tablets        OB History    Gravida  0   Para  0   Term  0   Preterm  0   AB  0   Living  0     SAB  0   TAB  0   Ectopic  0   Multiple  0   Live Births  0              There are no active problems to display for this patient.   Past Medical History:  Diagnosis Date  . Anal fissure   . Anal pain   . Anemia   . Anxiety   . Blood transfusion without reported diagnosis 2002   After Nissen Fundiplication  . Cataract   . Chronic headaches   . Depression   . Endometriosis   . GERD (gastroesophageal reflux disease)   . Hiatal hernia   . Hypertension    no meds/ in past  . IBS (irritable bowel syndrome)   . Lichen sclerosus   . Pancreatitis   . Panic attacks   . Peptic ulcer   . PVC (premature ventricular contraction)   . SOB (shortness of breath)    with panic attack and anxiety  . Wears glasses     Past Surgical History:  Procedure Laterality Date  . ABDOMINAL SURGERY    . CATARACT EXTRACTION, BILATERAL     Bil/ In 2018  . LAPAROSCOPIC NISSEN FUNDOPLICATION  5284   and Repair Hiatal Hernia  . LIPOMA EXCISION    . RECTAL EXAM UNDER ANESTHESIA N/A 12/06/2015   Procedure: ANAL  EXAM UNDER ANESTHESIA, BIOPSY;  Surgeon: Leighton Ruff, MD;  Location: [facility];  Service: General;  Laterality: N/A;  . TONSILLECTOMY  age 27  . TOTAL ABDOMINAL HYSTERECTOMY W/ BILATERAL SALPINGOOPHORECTOMY  1992    Current Outpatient Medications  Medication Sig Dispense Refill  . ALPRAZolam (XANAX) 1 MG tablet Take 1 tab 3 times a day    . augmented betamethasone dipropionate (DIPROLENE-AF) 0.05 % ointment Apply topically 2 (two) times daily.    . betamethasone valerate ointment (VALISONE) 0.1 % Apply 1 application topically 2 (two) times daily. 30 g 0  . calcium carbonate (TUMS - DOSED IN MG ELEMENTAL CALCIUM) 500 MG chewable tablet Chew 1 tablet by mouth daily.    Marland Kitchen Desvenlafaxine Succinate (PRISTIQ PO) Take 1 tablet by mouth daily.     Marland Kitchen docusate sodium (STOOL SOFTENER) 100 MG capsule Take 100 mg by mouth daily.    . Estradiol 10 MCG TABS vaginal tablet Place one tablet vaginally qhs x 1 week, then 2 x a week 24 tablet 0  . famotidine (PEPCID) 20 MG tablet Take 1 tablet (20 mg total) by mouth 2 (two)  times daily. 60 tablet 11  . hydrOXYzine (ATARAX/VISTARIL) 25 MG tablet Take 1 tab tid for anxiety (dose changed)    . nystatin cream (MYCOSTATIN) Apply 1 application topically daily.    Marland Kitchen OVER THE COUNTER MEDICATION OTC EMUAID MAX- apply to vaginal and rectal area PRN    . polyethylene glycol powder (GLYCOLAX/MIRALAX) powder Take 17 g by mouth 2 (two) times daily. 255 g 0  . psyllium (METAMUCIL) 58.6 % powder Take 1 packet by mouth daily.    Marland Kitchen topiramate (TOPAMAX) 25 MG tablet Take 1 tab twice a day for a week, then 2 tabs twice a day     Current Facility-Administered Medications  Medication Dose Route Frequency Provider Last Rate Last Dose  . 0.9 %  sodium chloride infusion  500 mL Intravenous Once Irene Shipper, MD         ALLERGIES: Hydrocodone, Other, and Zantac [ranitidine hcl]  Family History  Problem Relation Age of Onset  . Hypertension Mother   . Hyperlipidemia Mother   . Stroke Mother   . Diabetes Mother   . Irritable bowel syndrome Mother    . Kidney cancer Mother   . Lung cancer Father   . Irritable bowel syndrome Sister   . Cystic fibrosis Other   . Irritable bowel syndrome Sister   . Esophageal cancer Neg Hx   . Stomach cancer Neg Hx     Social History   Socioeconomic History  . Marital status: Divorced    Spouse name: Not on file  . Number of children: 0  . Years of education: Not on file  . Highest education level: Not on file  Occupational History  . 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 Financial resource strain: Not on file  . Food insecurity    Worry: Not on file    Inability: Not on file  . Transportation needs    Medical: Not on file    Non-medical: Not on file  Tobacco Use  . Smoking status: Never Smoker  . Smokeless tobacco: Never Used  Substance and Sexual Activity  . Alcohol use: Not Currently  . Drug use: No  . Sexual activity: Yes    Partners: Male    Birth control/protection: Post-menopausal  Lifestyle  . Physical activity    Days per week: Not on file    Minutes per session: Not on file  . Stress: Not on file  Relationships  . Social Herbalist on phone: Not on file    Gets together: Not on file    Attends religious service: Not on file    Active member of club or organization: Not on file    Attends meetings of clubs or organizations: Not on file    Relationship status: Not on file  . Intimate partner violence    Fear of current or ex partner: Not on file    Emotionally abused: Not on file    Physically abused: Not on file    Forced sexual activity: Not on file  Other Topics Concern  . Not on file  Social History Narrative  . Not on file    Review of Systems  Constitutional: Negative.   HENT: Negative.   Eyes: Negative.   Respiratory: Negative.   Cardiovascular: Negative.   Gastrointestinal: Negative.   Genitourinary: Negative.   Musculoskeletal: Negative.   Skin: Negative.   Neurological: Negative.   Endo/Heme/Allergies: Negative.    Psychiatric/Behavioral: Negative.     PHYSICAL EXAMINATION:  BP 122/82 (BP Location: Right Arm, Patient Position: Sitting, Cuff Size: Normal)   Pulse 80   Temp 98.2 F (36.8 C) (Skin)   Wt 135 lb 3.2 oz (61.3 kg)   BMI 23.21 kg/m     General appearance: alert, cooperative and appears stated age  Pelvic: External genitalia:  no lesions              Urethra:  normal appearing urethra with no masses, tenderness or lesions              Bartholins and Skenes: normal                 Vagina: atrophic appearing vagina (improved) with normal color and discharge, no lesions. Able to comfortably insert 2 fingers.              Cervix: absent  Perianal region: minimal whitening, no lesion, much improved.  Chaperone was present for exam.  ASSESSMENT Vaginal atrophy has improved Lichen sclerosis, mild Morphea, improved currently    PLAN Continue vaginal estrogen Aware of vulvar/perianal skin care F/U for annual exam in 2/21   An After Visit Summary was printed and given to the patient.

## 2019-05-24 ENCOUNTER — Encounter: Payer: Self-pay | Admitting: Internal Medicine

## 2019-06-15 ENCOUNTER — Encounter: Payer: Self-pay | Admitting: Obstetrics and Gynecology

## 2019-06-23 ENCOUNTER — Encounter: Payer: Self-pay | Admitting: Obstetrics and Gynecology

## 2019-06-27 ENCOUNTER — Telehealth: Payer: Self-pay | Admitting: Obstetrics and Gynecology

## 2019-06-27 NOTE — Telephone Encounter (Signed)
Results and any recommendations about her DEXA were sent via [facility].

## 2019-07-13 ENCOUNTER — Telehealth: Payer: Self-pay

## 2019-07-13 NOTE — Telephone Encounter (Signed)
-----   Message -----  From: Salvadore Dom, MD  Sent: 06/27/2019  To: April Houston  Subject: Unread Message Notification            Hi April Houston,  You still have osteopenia, but have improvement in your bone density! Good job!  Your current 10 year risk of any fracture is 8.8% and of hip fracture is 1.1%.  You should be getting 1,200 mg a day of calcium (between diet and supplements) a minimum of 800 IU of vit D3 and should be exercising regularly.  I would recommend a follow up bone density in 2 years.  Please reach out with any questions.  Take care,  Sumner Boast   Left message to call [facility] at [phone].

## 2019-07-17 IMAGING — DX DG ABDOMEN 1V
2 series · 2 of 2 positions shown · non-contrast
Comparison: None.

CLINICAL DATA: Patient has been unable to defecate for 3 days.

EXAM:
ABDOMEN - 1 VIEW

[abdomen kub (1 of 2)]
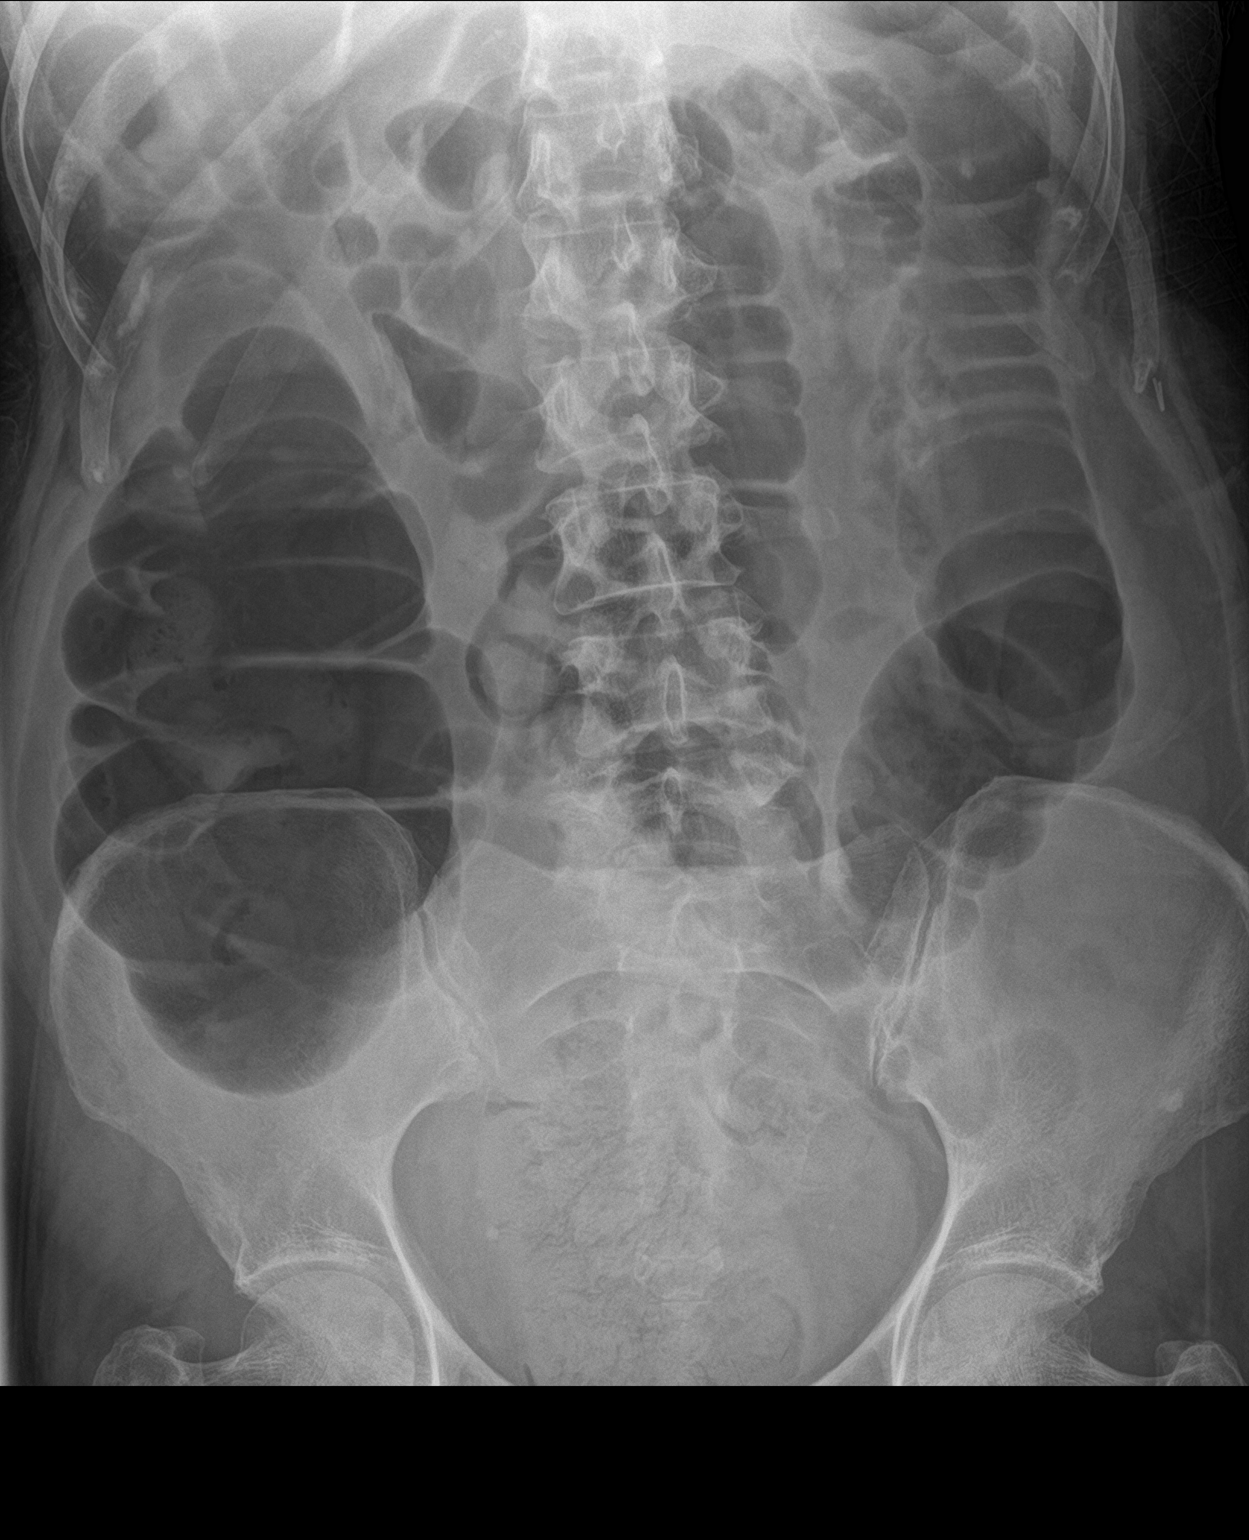

[abdomen kub (2 of 2)]
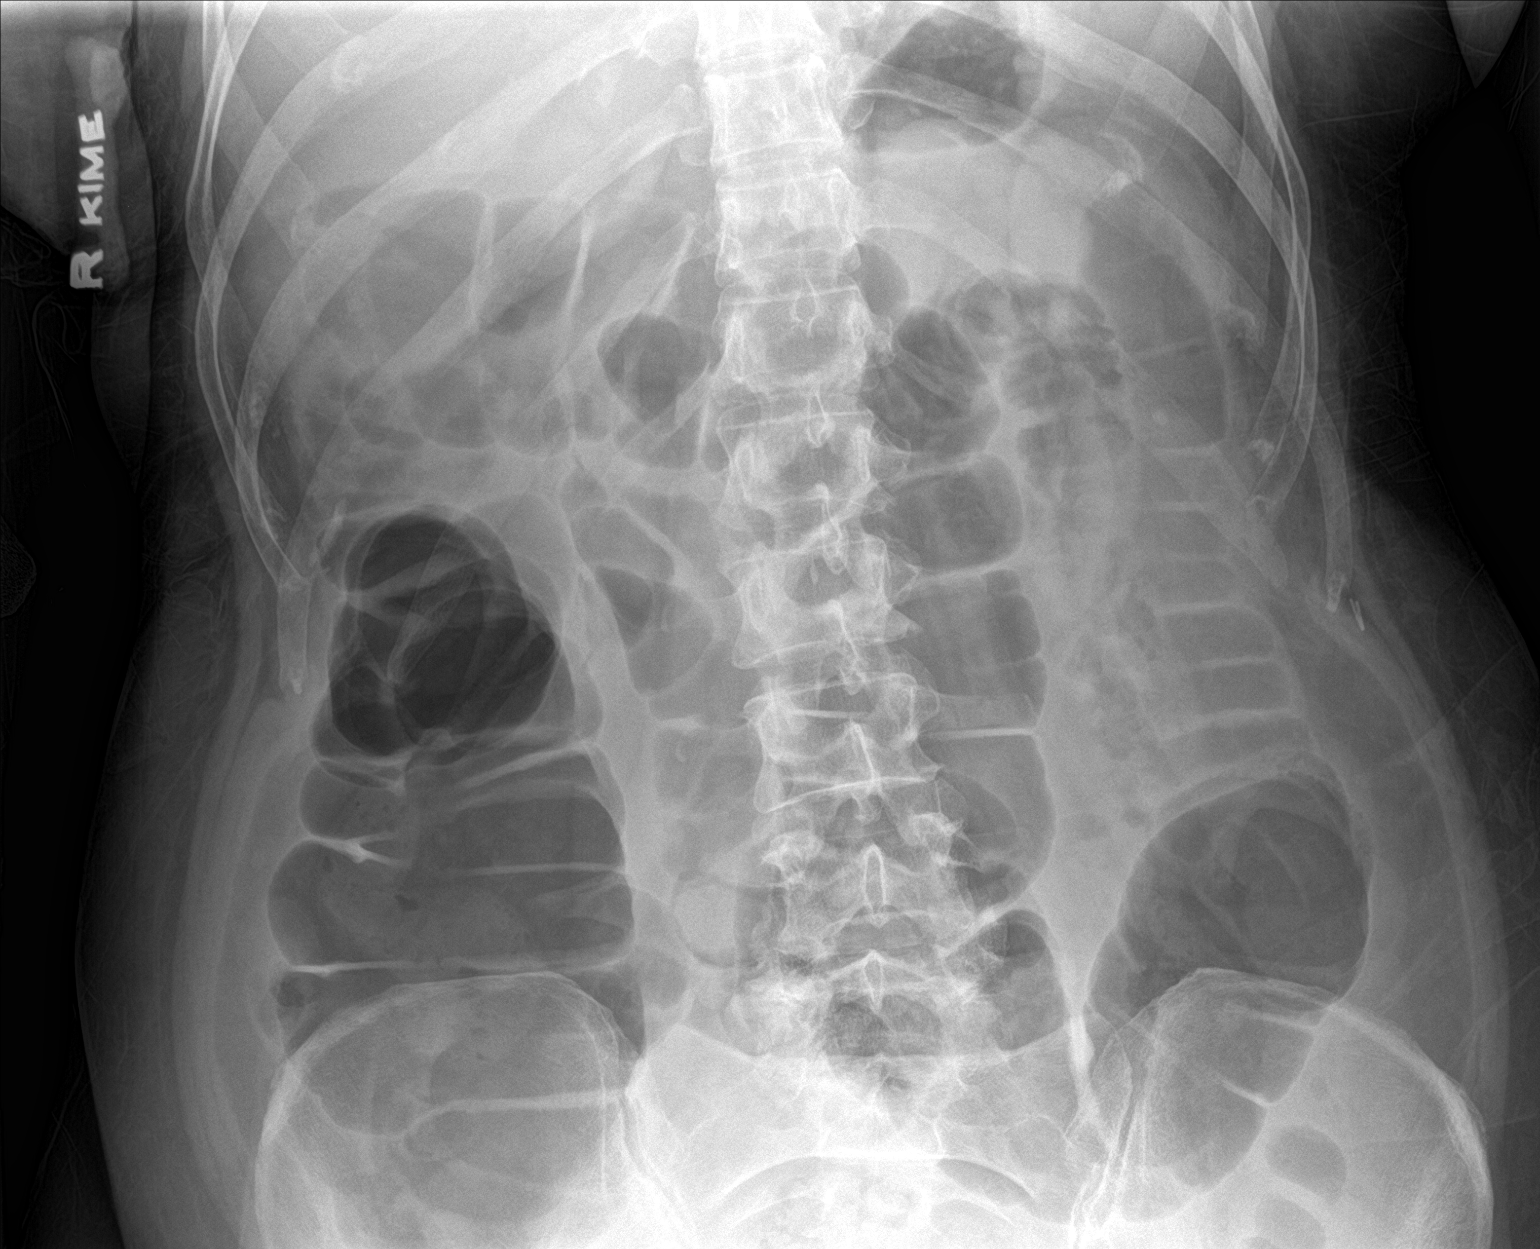

[2 of 2 positions shown; findings below may reference images not displayed]

FINDINGS: There is a large volume of stool in the rectosigmoid colon. Gaseous
distention of the remainder of the colon is identified. No evidence
of small bowel obstruction.
IMPRESSION: Large rectosigmoid colon stool ball consistent with fecal impaction.
There is gaseous distention of the remainder of the colon.

## 2019-07-20 ENCOUNTER — Encounter: Payer: Self-pay | Admitting: Internal Medicine

## 2019-07-20 ENCOUNTER — Ambulatory Visit: Payer: 59 | Admitting: Internal Medicine

## 2019-07-20 ENCOUNTER — Other Ambulatory Visit: Payer: Self-pay

## 2019-07-20 VITALS — BP 112/77 | HR 73 | Temp 97.6°F | Ht 64.0 in | Wt 128.0 lb

## 2019-07-20 DIAGNOSIS — K3184 Gastroparesis: Secondary | ICD-10-CM | POA: Diagnosis not present

## 2019-07-20 DIAGNOSIS — K219 Gastro-esophageal reflux disease without esophagitis: Secondary | ICD-10-CM

## 2019-07-20 DIAGNOSIS — R1310 Dysphagia, unspecified: Secondary | ICD-10-CM | POA: Diagnosis not present

## 2019-07-20 NOTE — Telephone Encounter (Signed)
Spoke with patient. Results given. Patient verbalizes understanding. Encounter closed. 

## 2019-07-20 NOTE — Progress Notes (Signed)
HISTORY OF PRESENT ILLNESS:  April Houston is a 73 y.o. female with a history of GERD, redo fundoplication, and gastroparesis.  She was seen in January with severe GERD despite pantoprazole 40 mg twice daily.  Upper endoscopy revealed evidence of prior fundoplication as well as retained gastric contents consistent with gastroparesis.  No evidence of active ulcer disease or gastric outlet obstruction.  She was advised with regards to reflux precautions and was subsequently seen for follow-up via telehealth medicine February 15, 2019.  At that time she continued to have active symptoms.  She felt that Nexium was causing headaches.  Thus, at her request, she was placed on famotidine 40 mg twice daily.  Follow-up in the office in 2 months recommended.  She follows up at this time.  Patient is pleased to report that she has aggressively worked at weight reduction.  She has lost 30 pounds this year.  She feels much better.  Overall this is helped her reflux symptoms.  She does awaken every morning with pyrosis.  She continues to have headaches despite changing from PPI to H2 receptor antagonist therapy.  For headaches she uses Tylenol.  She uses antacids on demand for pyrosis.  She has a new complaint of intermittent solid food dysphasia.  4 episodes over the past 2 months.  One episode quite severe.  No problems with dysphasia previously.  No additional complaints from a GI perspective other than bloating.  Her last colonoscopy elsewhere was performed June 2015.  She was found to have hyperplastic colon polyp.  REVIEW OF SYSTEMS:  All non-GI ROS negative unless otherwise stated in the HPI except for sinus and allergies, anxiety, confusion, visual change, depression, fatigue, headaches, heart rhythm changes-PVCs, sleeping problems  Past Medical History:  Diagnosis Date  . Anal fissure   . Anal pain   . Anemia   . Anxiety   . Blood transfusion without reported diagnosis 2002   After Nissen Fundiplication  .  Cataract   . Chronic headaches   . Depression   . Endometriosis   . GERD (gastroesophageal reflux disease)   . Hiatal hernia   . Hypertension    no meds/ in past  . IBS (irritable bowel syndrome)   . Lichen sclerosus   . Pancreatitis   . Panic attacks   . Peptic ulcer   . PVC (premature ventricular contraction)   . SOB (shortness of breath)    with panic attack and anxiety  . Wears glasses     Past Surgical History:  Procedure Laterality Date  . ABDOMINAL SURGERY    . CATARACT EXTRACTION, BILATERAL     Bil/ In 2018  . LAPAROSCOPIC NISSEN FUNDOPLICATION  [id number]   and Repair Hiatal Hernia  . LIPOMA EXCISION    . RECTAL EXAM UNDER ANESTHESIA N/A 12/06/2015   Procedure: ANAL  EXAM UNDER ANESTHESIA, BIOPSY;  Surgeon: Leighton Ruff, MD;  Location: [facility];  Service: General;  Laterality: N/A;  . TONSILLECTOMY  age 86  . TOTAL ABDOMINAL HYSTERECTOMY W/ BILATERAL SALPINGOOPHORECTOMY  1992    Social History SKYYE GABA  reports that she has never smoked. She has never used smokeless tobacco. She reports previous alcohol use. She reports that she does not use drugs.  family history includes Cystic fibrosis in an other family member; Diabetes in her mother; Hyperlipidemia in her mother; Hypertension in her mother; Irritable bowel syndrome in her mother, sister, and sister; Kidney cancer in her mother; Lung cancer in her father;  Stroke in her mother.  Allergies  Allergen Reactions  . Hydrocodone Itching    severe  . Other Other (See Comments)    Severe headaches w/ most gerd medication IV and PO   . Zantac [Ranitidine Hcl] Other (See Comments)    headaches       PHYSICAL EXAMINATION: Vital signs: BP 112/77   Pulse 73   Temp 97.6 F (36.4 C)   Ht 5\' 4"  (1.626 m)   Wt 128 lb (58.1 kg)   BMI 21.97 kg/m   Constitutional: generally well-appearing, no acute distress Psychiatric: alert and oriented x3, cooperative Eyes: extraocular movements intact,  anicteric, conjunctiva pink Mouth: oral pharynx moist, no lesions Neck: supple no lymphadenopathy Cardiovascular: heart regular rate and rhythm, no murmur Lungs: clear to auscultation bilaterally Abdomen: soft, nontender, nondistended, no obvious ascites, no peritoneal signs, normal bowel sounds, no organomegaly Rectal: Omitted Extremities: no clubbing, cyanosis, or lower extremity edema bilaterally Skin: no lesions on visible extremities Neuro: No focal deficits.  Cranial nerves intact  ASSESSMENT:  1.  GERD.  Status post redo fundoplication.  Breakthrough symptoms on famotidine 40 mg twice daily. 2.  New onset dysphasia to solids with transient food impaction.  Rule out esophagitis.  Rule out stricture 3.  Gastroparesis 4.  Colonoscopy 2015 with hyperplastic polyp   PLAN:  1.  Reflux precautions.  The patient applauded for her could work toward weight reduction 2.  Continue famotidine 40 mg twice daily.  May need to return to PPI therapy if esophagitis identified 3.  Schedule upper endoscopy with esophageal dilation.The nature of the procedure, as well as the risks, benefits, and alternatives were carefully and thoroughly reviewed with the patient. Ample time for discussion and questions allowed. The patient understood, was satisfied, and agreed to proceed. 4.  Colonoscopy around 2025

## 2019-07-20 NOTE — Patient Instructions (Signed)

## 2019-07-25 ENCOUNTER — Telehealth: Payer: Self-pay

## 2019-07-25 NOTE — Telephone Encounter (Signed)
Covid-19 screening questions   Do you now or have you had a fever in the last 14 days?  Do you have any respiratory symptoms of shortness of breath or cough now or in the last 14 days?  Do you have any family members or close contacts with diagnosed or suspected Covid-19 in the past 14 days?  Have you been tested for Covid-19 and found to be positive?       

## 2019-07-25 NOTE — Telephone Encounter (Signed)
Pt answered  "NO" to all covid questions. °

## 2019-07-26 ENCOUNTER — Other Ambulatory Visit: Payer: Self-pay

## 2019-07-26 ENCOUNTER — Ambulatory Visit (AMBULATORY_SURGERY_CENTER): Payer: 59 | Admitting: Internal Medicine

## 2019-07-26 ENCOUNTER — Encounter: Payer: Self-pay | Admitting: Internal Medicine

## 2019-07-26 VITALS — BP 129/74 | HR 63 | Temp 97.8°F | Resp 11 | Ht 64.0 in | Wt 128.0 lb

## 2019-07-26 DIAGNOSIS — R1310 Dysphagia, unspecified: Secondary | ICD-10-CM

## 2019-07-26 DIAGNOSIS — K219 Gastro-esophageal reflux disease without esophagitis: Secondary | ICD-10-CM

## 2019-07-26 DIAGNOSIS — Q396 Congenital diverticulum of esophagus: Secondary | ICD-10-CM

## 2019-07-26 MED ORDER — SODIUM CHLORIDE 0.9 % IV SOLN: 500.0000 mL | Freq: Once | INTRAVENOUS | Status: DC

## 2019-07-26 MED ORDER — OMEPRAZOLE 40 MG PO CPDR: 40.0000 mg | DELAYED_RELEASE_CAPSULE | Freq: Two times a day (BID) | ORAL | 11 refills | Status: DC

## 2019-07-26 NOTE — Progress Notes (Signed)
A and O x3. Report to RN. Tolerated MAC anesthesia well.

## 2019-07-26 NOTE — Patient Instructions (Signed)
YOU HAD AN ENDOSCOPIC PROCEDURE TODAY AT [facility]:   Refer to the procedure report that was given to you for any specific questions about what was found during the examination.  If the procedure report does not answer your questions, please call your gastroenterologist to clarify.  If you requested that your care partner not be given the details of your procedure findings, then the procedure report has been included in a sealed envelope for you to review at your convenience later.  YOU SHOULD EXPECT: Some feelings of bloating in the abdomen. Passage of more gas than usual.  Walking can help get rid of the air that was put into your GI tract during the procedure and reduce the bloating. If you had a lower endoscopy (such as a colonoscopy or flexible sigmoidoscopy) you may notice spotting of blood in your stool or on the toilet paper. If you underwent a bowel prep for your procedure, you may not have a normal bowel movement for a few days.  Please Note:  You might notice some irritation and congestion in your nose or some drainage.  This is from the oxygen used during your procedure.  There is no need for concern and it should clear up in a day or so.  SYMPTOMS TO REPORT IMMEDIATELY:    Following upper endoscopy (EGD)  Vomiting of blood or coffee ground material  New chest pain or pain under the shoulder blades  Painful or persistently difficult swallowing  New shortness of breath  Fever of 100F or higher  Black, tarry-looking stools  For urgent or emergent issues, a gastroenterologist can be reached at any hour by calling [phone].   DIET:  Please follow a post-dilation diet (see handout); nothing by mouth until 12:00pm today, then clear liquids only for 2 hours starting at 12:00pm, then follow a soft diet for the rest of the day today starting at 2:00pm. You may proceed to your regular diet tomorrow as tolerated.  Drink plenty of fluids but you should avoid alcoholic  beverages for 24 hours.  MEDICATIONS: Start omeprazole 40 mg by mouth twice daily, #60 tablets prescribed with 11 refills. This has been electronically sent to your pharmacy.  FOLLOW UP: Please schedule a follow-up with Dr. Henrene Pastor in about 6 weeks in his office.  Please see handouts given to you by your recovery nurse.  ACTIVITY:  You should plan to take it easy for the rest of today and you should NOT DRIVE or use heavy machinery until tomorrow (because of the sedation medicines used during the test).    FOLLOW UP: Our staff will call the number listed on your records 48-72 hours following your procedure to check on you and address any questions or concerns that you may have regarding the information given to you following your procedure. If we do not reach you, we will leave a message.  We will attempt to reach you two times.  During this call, we will ask if you have developed any symptoms of COVID 19. If you develop any symptoms (ie: fever, flu-like symptoms, shortness of breath, cough etc.) before then, please call [phone].  If you test positive for Covid 19 in the 2 weeks post procedure, please call and report this information to [place].    If any biopsies were taken you will be contacted by phone or by letter within the next 1-3 weeks.  Please call us at [phone] if you have not heard about the biopsies in 3  weeks.   Thank you for allowing [place] to provide for your healthcare needs today.   SIGNATURES/CONFIDENTIALITY: You and/or your care partner have signed paperwork which will be entered into your electronic medical record.  These signatures attest to the fact that that the information above on your After Visit Summary has been reviewed and is understood.  Full responsibility of the confidentiality of this discharge information lies with you and/or your care-partner.

## 2019-07-26 NOTE — Progress Notes (Signed)
Temp taken by KA VS taken by CW 

## 2019-07-26 NOTE — Op Note (Signed)
[place] Patient Name: April Houston Procedure Date: 07/26/2019 10:29 AM MRN: [id number] Endoscopist: Docia Chuck. Henrene Pastor , MD Age: 73 Referring MD:  Date of Birth: [birth year] Gender: Female Account #: [id number] Procedure:                Upper GI endoscopy with [facility] dilation of the                            esophagus Indications:              Dysphagia, Esophageal reflux Medicines:                Monitored Anesthesia Care Procedure:                Pre-Anesthesia Assessment:                           - Prior to the procedure, a History and Physical                            was performed, and patient medications and                            allergies were reviewed. The patient's tolerance of                            previous anesthesia was also reviewed. The risks                            and benefits of the procedure and the sedation                            options and risks were discussed with the patient.                            All questions were answered, and informed consent                            was obtained. Prior Anticoagulants: The patient has                            taken no previous anticoagulant or antiplatelet                            agents. ASA Grade Assessment: II - A patient with                            mild systemic disease. After reviewing the risks                            and benefits, the patient was deemed in                            satisfactory condition to undergo the procedure.  After obtaining informed consent, the endoscope was                            passed under direct vision. Throughout the                            procedure, the patient's blood pressure, pulse, and                            oxygen saturations were monitored continuously. The                            Endoscope was introduced through the mouth, and                            advanced to the second part of  duodenum. The upper                            GI endoscopy was accomplished without difficulty.                            The patient tolerated the procedure well. Scope In: Scope Out: Findings:                 A shallow diverticulum with a large opening was                            found in the lower third of the esophagus, 2 cm                            above the gastroesophageal junction.                           The esophagus was otherwise normal. Resistance at                            GE junction from prior fundoplication, but no                            clear-cut stricture. The scope was withdrawn.                            Empiric dilation was performed with a Maloney                            dilator with no resistance at 65 Fr.                           The stomach was normal with evidence of prior                            fundoplication, for the most part intact.  The examined duodenum was normal.                           The cardia and gastric fundus were normal on                            retroflexion. Complications:            No immediate complications. Estimated Blood Loss:     Estimated blood loss: none. Impression:               1. Distal esophageal diverticulum                           2. Dysphagia status post Maloney dilation of the                            esophagus                           3. GERD with prior history of fundoplication.                            Ongoing symptoms on famotidine. Recommendation:           1. Post dilation diet                           2. Prescribe omeprazole 40 mg twice daily; #60; 11                            refills                           3. Please make office follow-up with Dr. Henrene Pastor in                            about 6 weeks. Docia Chuck. Henrene Pastor, MD 07/26/2019 10:47:24 AM This report has been signed electronically.

## 2019-07-26 NOTE — Progress Notes (Signed)
Called to room to assist during endoscopic procedure.  Patient ID and intended procedure confirmed with present staff. Received instructions for my participation in the procedure from the performing physician.  

## 2019-07-28 ENCOUNTER — Telehealth: Payer: Self-pay

## 2019-07-28 NOTE — Telephone Encounter (Signed)
Covid-19 screening questions   Do you now or have you had a fever in the last 14 days? No.  Do you have any respiratory symptoms of shortness of breath or cough now or in the last 14 days? No.  Do you have any family members or close contacts with diagnosed or suspected Covid-19 in the past 14 days? No.  Have you been tested for Covid-19 and found to be positive? No.       Follow up Call-  Call back number 07/26/2019 10/31/2018 01/31/2018 12/06/2017  Post procedure Call Back phone  # [phone]  Permission to leave phone message Yes Yes Yes Yes  Some recent data might be hidden     Patient questions:  Do you have a fever, pain , or abdominal swelling? No. Pain Score  0 *  Have you tolerated food without any problems? Yes.    Have you been able to return to your normal activities? Yes.    Do you have any questions about your discharge instructions: Diet   No. Medications  No. Follow up visit  No.  Do you have questions or concerns about your Care? No.  Actions: * If pain score is 4 or above: No action needed, pain <4.

## 2019-09-04 ENCOUNTER — Other Ambulatory Visit: Payer: Self-pay | Admitting: Obstetrics and Gynecology

## 2019-09-04 NOTE — Telephone Encounter (Signed)
Medication refill request: Valisone  Last AEX:  11-17-2018 JJ  Next AEX: 11-29-19 Last MMG (if hormonal medication request): n/a Refill authorized: Today, please advise.   Medication pended for #30g, 0RF. Please refill if appropriate.

## 2019-09-08 ENCOUNTER — Other Ambulatory Visit: Payer: Self-pay

## 2019-09-08 ENCOUNTER — Ambulatory Visit: Payer: 59 | Admitting: Internal Medicine

## 2019-09-08 ENCOUNTER — Encounter: Payer: Self-pay | Admitting: Internal Medicine

## 2019-09-08 VITALS — BP 110/72 | HR 84 | Temp 97.8°F | Ht 64.0 in | Wt 126.8 lb

## 2019-09-08 DIAGNOSIS — Q396 Congenital diverticulum of esophagus: Secondary | ICD-10-CM | POA: Diagnosis not present

## 2019-09-08 DIAGNOSIS — K219 Gastro-esophageal reflux disease without esophagitis: Secondary | ICD-10-CM | POA: Diagnosis not present

## 2019-09-08 DIAGNOSIS — K3184 Gastroparesis: Secondary | ICD-10-CM

## 2019-09-08 MED ORDER — OMEPRAZOLE 40 MG PO CPDR: 40.0000 mg | DELAYED_RELEASE_CAPSULE | Freq: Two times a day (BID) | ORAL | 3 refills | Status: DC

## 2019-09-08 NOTE — Patient Instructions (Addendum)
We have sent the following medications to your pharmacy for you to pick up at your convenience:  Omeprazole.  Please follow up in one year  

## 2019-09-08 NOTE — Progress Notes (Signed)
HISTORY OF PRESENT ILLNESS:  April Houston is a 73 y.o. female with a history of GERD, redo fundoplication, and gastroparesis.  She was last seen July 20, 2019 regarding breakthrough GERD symptoms and new dysphagia to solids with transient food impaction.  See that dictation.  She subsequently underwent upper endoscopy July 26, 2019.  She was found to have an esophageal diverticulum.  Slight resistance at the gastroesophageal junction from prior fundoplication.  No retained gastric contents.  The esophagus was dilated with a 54 [place] Maloney dilator.  The patient presents today for follow-up as requested.  She is currently taking omeprazole 40 mg twice daily.  No reflux symptoms.  She is pleased.  As well, no significant recurrent issues with her dysphagia.  No appreciable medication side effects.  No new complaints  REVIEW OF SYSTEMS:  All non-GI ROS negative unless otherwise stated in the HPI.  Past Medical History:  Diagnosis Date  . Anal fissure   . Anal pain   . Anemia   . Anxiety   . Blood transfusion without reported diagnosis 2002   After Nissen Fundiplication  . Cataract   . Chronic headaches   . Depression   . Endometriosis   . GERD (gastroesophageal reflux disease)   . Hiatal hernia   . Hypertension    no meds/ in past  . IBS (irritable bowel syndrome)   . Lichen sclerosus   . Pancreatitis   . Panic attacks   . Peptic ulcer   . PVC (premature ventricular contraction)   . SOB (shortness of breath)    with panic attack and anxiety  . Wears glasses     Past Surgical History:  Procedure Laterality Date  . CATARACT EXTRACTION, BILATERAL     Bil/ In 2018  . LAPAROSCOPIC NISSEN FUNDOPLICATION  [id number]   and Repair Hiatal Hernia  . LIPOMA EXCISION    . RECTAL EXAM UNDER ANESTHESIA N/A 12/06/2015   Procedure: ANAL  EXAM UNDER ANESTHESIA, BIOPSY;  Surgeon: Leighton Ruff, MD;  Location: [facility];  Service: General;  Laterality: N/A;  . TONSILLECTOMY   age 85  . TOTAL ABDOMINAL HYSTERECTOMY W/ BILATERAL SALPINGOOPHORECTOMY  1992    Social History April Houston  reports that she has never smoked. She has never used smokeless tobacco. She reports previous alcohol use. She reports that she does not use drugs.  family history includes Cystic fibrosis in an other family member; Diabetes in her mother; Hyperlipidemia in her mother; Hypertension in her mother; Irritable bowel syndrome in her mother, sister, and sister; Kidney cancer in her mother; Lung cancer in her father; Stroke in her mother.  Allergies  Allergen Reactions  . Hydrocodone Itching    severe  . Other Other (See Comments)    Severe headaches w/ most gerd medication IV and PO   . Zantac [Ranitidine Hcl] Other (See Comments)    headaches       PHYSICAL EXAMINATION: Vital signs: BP 110/72   Pulse 84   Temp 97.8 F (36.6 C)   Ht 5\' 4"  (1.626 m)   Wt 126 lb 12.8 oz (57.5 kg)   BMI 21.77 kg/m   Constitutional: generally well-appearing, no acute distress Psychiatric: alert and oriented x3, cooperative Eyes:  anicteric,  Mouth: Mask Abdomen: Not reexamined Neuro: No focal deficits.   ASSESSMENT:  1.  GERD.  Status post fundoplication and redo fundoplication.  Breakthrough GERD symptoms improved with omeprazole 40 mg twice daily 2.  Dysphagia.  The patient  does have esophageal diverticulum suggesting motility.  As well slight resistance at the gastroesophageal junction from prior fundoplication.  Issues with dysphagia have improved post dilation with 54 [place] Maloney dilator. 3.  Gastroparesis 4.  Colonoscopy 2015 with hyperplastic polyp   PLAN:  1.  Reflux precautions 2.  Continue omeprazole 40 mg twice daily.  Prescription refilled for 1 year 3.  Medication risks reviewed. 4.  Routine GI office follow-up 1 year 5.  Follow-up colonoscopy around 2025 for the purposes of surveillance 15 minutes spent face-to-face with the patient.  Greater than 50% the time used  for counseling regarding management of her complicated GERD and its follow-up

## 2019-10-31 DIAGNOSIS — L659 Nonscarring hair loss, unspecified: Secondary | ICD-10-CM | POA: Insufficient documentation

## 2019-11-15 ENCOUNTER — Other Ambulatory Visit: Payer: Self-pay | Admitting: Obstetrics and Gynecology

## 2019-11-15 DIAGNOSIS — N952 Postmenopausal atrophic vaginitis: Secondary | ICD-10-CM

## 2019-11-15 DIAGNOSIS — L900 Lichen sclerosus et atrophicus: Secondary | ICD-10-CM

## 2019-11-15 NOTE — Telephone Encounter (Signed)
Med refill request: April Houston Last AEX: 05/18/2019 Next AEX: 11/29/2019 Last MMG (if hormonal med) 06/23/2019, BIRADS 2, Benign Refill authorized: #8 tablets, 0RF. Pended if approved.

## 2019-11-27 NOTE — Progress Notes (Signed)
74 y.o. G0P0000 Divorced April Houston or Caucasian Not Hispanic or Latino female here for annual exam.  H/O TAH/BSO   H/O vaginal atrophy, dyspareunia, lichen sclerosis. Last summer she was started on vaginal estrogen and topical steroids (uses 2 x a week). Doing well. Sexually active, same partner as last year, no pain. No vaginal bleeding.   She also has a h/o morphea and a perianal lesion, dermatologist thought it was a nevus.    No LMP recorded. Patient has had a hysterectomy.          Sexually active: Yes.    The current method of family planning is status post hysterectomy.    Exercising: Yes.    walking the dog twice a day  Smoker:  no  Health Maintenance: Pap:  Years ago  History of abnormal Pap:  no MMG:  06/15/19 incomplete, 06/23/19 f/u imaging benign BMD:   06/15/19 low bone mass T-Score -1.3, improved, FRAX 8.8/1.1% Colonoscopy: 2017 polyp - repeat in 10 yrs TDaP:  2015 Gardasil: NA   reports that she has never smoked. She has never used smokeless tobacco. She reports previous alcohol use. She reports that she does not use drugs. She is an Surveyor, minerals, works for the city of [place]. Sister moved out, much easier.  She has dog that she adores and 5 cats.   Past Medical History:  Diagnosis Date  . Anal fissure   . Anal pain   . Anemia   . Anxiety   . Blood transfusion without reported diagnosis 2002   After Nissen Fundiplication  . Cataract   . Chronic headaches   . Depression   . Endometriosis   . GERD (gastroesophageal reflux disease)   . Hiatal hernia   . Hypertension    no meds/ in past  . IBS (irritable bowel syndrome)   . Lichen sclerosus   . Pancreatitis   . Panic attacks   . Peptic ulcer   . PVC (premature ventricular contraction)   . SOB (shortness of breath)    with panic attack and anxiety  . Wears glasses     Past Surgical History:  Procedure Laterality Date  . CATARACT EXTRACTION, BILATERAL     Bil/ In 2018  . LAPAROSCOPIC NISSEN  FUNDOPLICATION  [id number]   and Repair Hiatal Hernia  . LIPOMA EXCISION    . RECTAL EXAM UNDER ANESTHESIA N/A 12/06/2015   Procedure: ANAL  EXAM UNDER ANESTHESIA, BIOPSY;  Surgeon: Leighton Ruff, MD;  Location: [place];  Service: General;  Laterality: N/A;  . TONSILLECTOMY  age 19  . TOTAL ABDOMINAL HYSTERECTOMY W/ BILATERAL SALPINGOOPHORECTOMY  1992    Current Outpatient Medications  Medication Sig Dispense Refill  . ALPRAZolam (XANAX) 1 MG tablet Take 1 tab 3 times a day    . augmented betamethasone dipropionate (DIPROLENE-AF) 0.05 % ointment Apply topically 2 (two) times daily.    . betamethasone valerate ointment (VALISONE) 0.1 % Apply a pea sized amount topically BID for up to 1-2 weeks as needed. Can use 1-2 x a week as maintenance if needed. Not for daily long term use. 30 g 0  . calcium carbonate (TUMS - DOSED IN MG ELEMENTAL CALCIUM) 500 MG chewable tablet Chew 1 tablet by mouth daily.    Marland Kitchen Desvenlafaxine Succinate (PRISTIQ PO) Take 1 tablet by mouth daily.     Marland Kitchen docusate sodium (STOOL SOFTENER) 100 MG capsule Take 100 mg by mouth daily.    . hydrOXYzine (ATARAX/VISTARIL) 25 MG tablet Take 1  tab tid for anxiety (dose changed)    . omeprazole (PRILOSEC) 40 MG capsule Take 1 capsule (40 mg total) by mouth 2 (two) times daily. 180 capsule 3  . OVER THE COUNTER MEDICATION OTC EMUAID MAX- apply to vaginal and rectal area PRN    . polyethylene glycol powder (GLYCOLAX/MIRALAX) powder Take 17 g by mouth 2 (two) times daily. 255 g 0  . psyllium (METAMUCIL) 58.6 % powder Take 1 packet by mouth daily.    Marland Kitchen topiramate (TOPAMAX) 25 MG tablet Take 1 tab twice a day for a week, then 2 tabs twice a day    . Vitamin D, Ergocalciferol, (DRISDOL) 1.25 MG (50000 UNIT) CAPS capsule Take 50,000 Units by mouth every 7 (seven) days.    Merril Abbe 10 MCG TABS vaginal tablet PLACE ONE TABLET VAGINALLY AT BEDTIME X 1 WEEK, THEN USE 1 TABLET 2 TIMES A WEEK 8 tablet 0   No current  facility-administered medications for this visit.    Family History  Problem Relation Age of Onset  . Hypertension Mother   . Hyperlipidemia Mother   . Stroke Mother   . Diabetes Mother   . Irritable bowel syndrome Mother   . Kidney cancer Mother   . Lung cancer Father   . Irritable bowel syndrome Sister   . Cystic fibrosis Other   . Irritable bowel syndrome Sister   . Esophageal cancer Neg Hx   . Stomach cancer Neg Hx   . Rectal cancer Neg Hx   . Colon cancer Neg Hx     Review of Systems  All other systems reviewed and are negative.   Exam:   BP 128/62   Pulse 86   Temp 97.9 F (36.6 C)   Ht 5\' 4"  (1.626 m)   Wt 129 lb (58.5 kg)   SpO2 98%   BMI 22.14 kg/m   Weight change: @WEIGHTCHANGE @ Height:   Height: 5\' 4"  (162.6 cm)  Ht Readings from Last 3 Encounters:  11/29/19 5\' 4"  (1.626 m)  09/08/19 5\' 4"  (1.626 m)  07/26/19 5\' 4"  (1.626 m)    General appearance: alert, cooperative and appears stated age Head: Normocephalic, without obvious abnormality, atraumatic Neck: no adenopathy, supple, symmetrical, trachea midline and thyroid normal to inspection and palpation Lungs: clear to auscultation bilaterally Cardiovascular: regular rate and rhythm Breasts: normal appearance, no masses or tenderness Abdomen: soft, non-tender; non distended,  no masses,  no organomegaly Extremities: extremities normal, atraumatic, no cyanosis or edema Skin: Skin color, texture, turgor normal. No rashes or lesions Lymph nodes: Cervical, supraclavicular, and axillary nodes normal. No abnormal inguinal nodes palpated Neurologic: Grossly normal   Pelvic: External genitalia:  no lesions, mild loss of architecture, no significant whitening              Urethra:  normal appearing urethra with no masses, tenderness or lesions              Bartholins and Skenes: normal                 Vagina: normal appearing vagina with normal color and discharge, no lesions              Cervix: absent                Bimanual Exam:  Uterus:  uterus absent              Adnexa: no mass, fullness, tenderness  Rectovaginal: Confirms               Anus:  normal sphincter tone, no lesions, mild perianal whitening  Gae Dry chaperoned for the exam.  A:  Well Woman with normal exam  Lichen Sclerosis, mild, controled with steroid ointment (uses 2 x a week)  Vaginal atrophy, improved with vaginal estrogen  P:   No pap needed  Mammogram, colonoscopy, DEXA UTD  Discussed breast self exam  Discussed calcium and vit D intake  Continue with vaginal estrogen and steroid ointment.

## 2019-11-29 ENCOUNTER — Encounter: Payer: Self-pay | Admitting: Obstetrics and Gynecology

## 2019-11-29 ENCOUNTER — Other Ambulatory Visit: Payer: Self-pay

## 2019-11-29 ENCOUNTER — Ambulatory Visit: Payer: 59 | Admitting: Obstetrics and Gynecology

## 2019-11-29 VITALS — BP 128/62 | HR 86 | Temp 97.9°F | Ht 64.0 in | Wt 129.0 lb

## 2019-11-29 DIAGNOSIS — L900 Lichen sclerosus et atrophicus: Secondary | ICD-10-CM

## 2019-11-29 DIAGNOSIS — Z01419 Encounter for gynecological examination (general) (routine) without abnormal findings: Secondary | ICD-10-CM | POA: Diagnosis not present

## 2019-11-29 DIAGNOSIS — N952 Postmenopausal atrophic vaginitis: Secondary | ICD-10-CM | POA: Diagnosis not present

## 2019-11-29 MED ORDER — ESTRADIOL 10 MCG VA TABS: ORAL_TABLET | VAGINAL | 3 refills | Status: DC

## 2019-11-29 MED ORDER — BETAMETHASONE VALERATE 0.1 % EX OINT: TOPICAL_OINTMENT | CUTANEOUS | 0 refills | Status: DC

## 2019-11-29 NOTE — Patient Instructions (Signed)
EXERCISE AND DIET:  We recommended that you start or continue a regular exercise program for good health. Regular exercise means any activity that makes your heart beat faster and makes you sweat.  We recommend exercising at least 30 minutes per day at least 3 days a week, preferably 4 or 5.  We also recommend a diet low in fat and sugar.  Inactivity, poor dietary choices and obesity can cause diabetes, heart attack, stroke, and kidney damage, among others.    ALCOHOL AND SMOKING:  Women should limit their alcohol intake to no more than 7 drinks/beers/glasses of wine (combined, not each!) per week. Moderation of alcohol intake to this level decreases your risk of breast cancer and liver damage. And of course, no recreational drugs are part of a healthy lifestyle.  And absolutely no smoking or even second hand smoke. Most people know smoking can cause heart and lung diseases, but did you know it also contributes to weakening of your bones? Aging of your skin?  Yellowing of your teeth and nails?  CALCIUM AND VITAMIN D:  Adequate intake of calcium and Vitamin D are recommended.  The recommendations for exact amounts of these supplements seem to change often, but generally speaking 1,200 mg of calcium (between diet and supplement) and 800 units of Vitamin D per day seems prudent. Certain women may benefit from higher intake of Vitamin D.  If you are among these women, your doctor will have told you during your visit.    PAP SMEARS:  Pap smears, to check for cervical cancer or precancers,  have traditionally been done yearly, although recent scientific advances have shown that most women can have pap smears less often.  However, every woman still should have a physical exam from her gynecologist every year. It will include a breast check, inspection of the vulva and vagina to check for abnormal growths or skin changes, a visual exam of the cervix, and then an exam to evaluate the size and shape of the uterus and  ovaries.  And after 72 years of age, a rectal exam is indicated to check for rectal cancers. We will also provide age appropriate advice regarding health maintenance, like when you should have certain vaccines, screening for sexually transmitted diseases, bone density testing, colonoscopy, mammograms, etc.   MAMMOGRAMS:  All women over 40 years old should have a yearly mammogram. Many facilities now offer a "3D" mammogram, which may cost around $50 extra out of pocket. If possible,  we recommend you accept the option to have the 3D mammogram performed.  It both reduces the number of women who will be called back for extra views which then turn out to be normal, and it is better than the routine mammogram at detecting truly abnormal areas.    COLON CANCER SCREENING: Now recommend starting at age 45. At this time colonoscopy is not covered for routine screening until 50. There are take home tests that can be done between 45-49.   COLONOSCOPY:  Colonoscopy to screen for colon cancer is recommended for all women at age 50.  We know, you hate the idea of the prep.  We agree, BUT, having colon cancer and not knowing it is worse!!  Colon cancer so often starts as a polyp that can be seen and removed at colonscopy, which can quite literally save your life!  And if your first colonoscopy is normal and you have no family history of colon cancer, most women don't have to have it again for   10 years.  Once every ten years, you can do something that may end up saving your life, right?  We will be happy to help you get it scheduled when you are ready.  Be sure to check your insurance coverage so you understand how much it will cost.  It may be covered as a preventative service at no cost, but you should check your particular policy.      Breast Self-Awareness Breast self-awareness means being familiar with how your breasts look and feel. It involves checking your breasts regularly and reporting any changes to your  health care provider. Practicing breast self-awareness is important. A change in your breasts can be a sign of a serious medical problem. Being familiar with how your breasts look and feel allows you to find any problems early, when treatment is more likely to be successful. All women should practice breast self-awareness, including women who have had breast implants. How to do a breast self-exam One way to learn what is normal for your breasts and whether your breasts are changing is to do a breast self-exam. To do a breast self-exam: Look for Changes  1. Remove all the clothing above your waist. 2. Stand in front of a mirror in a room with good lighting. 3. Put your hands on your hips. 4. Push your hands firmly downward. 5. Compare your breasts in the mirror. Look for differences between them (asymmetry), such as: ? Differences in shape. ? Differences in size. ? Puckers, dips, and bumps in one breast and not the other. 6. Look at each breast for changes in your skin, such as: ? Redness. ? Scaly areas. 7. Look for changes in your nipples, such as: ? Discharge. ? Bleeding. ? Dimpling. ? Redness. ? A change in position. Feel for Changes Carefully feel your breasts for lumps and changes. It is best to do this while lying on your back on the floor and again while sitting or standing in the shower or tub with soapy water on your skin. Feel each breast in the following way:  Place the arm on the side of the breast you are examining above your head.  Feel your breast with the other hand.  Start in the nipple area and make  inch (2 cm) overlapping circles to feel your breast. Use the pads of your three middle fingers to do this. Apply light pressure, then medium pressure, then firm pressure. The light pressure will allow you to feel the tissue closest to the skin. The medium pressure will allow you to feel the tissue that is a little deeper. The firm pressure will allow you to feel the tissue  close to the ribs.  Continue the overlapping circles, moving downward over the breast until you feel your ribs below your breast.  Move one finger-width toward the center of the body. Continue to use the  inch (2 cm) overlapping circles to feel your breast as you move slowly up toward your collarbone.  Continue the up and down exam using all three pressures until you reach your armpit.  Write Down What You Find  Write down what is normal for each breast and any changes that you find. Keep a written record with breast changes or normal findings for each breast. By writing this information down, you do not need to depend only on memory for size, tenderness, or location. Write down where you are in your menstrual cycle, if you are still menstruating. If you are having trouble noticing differences   in your breasts, do not get discouraged. With time you will become more familiar with the variations in your breasts and more comfortable with the exam. How often should I examine my breasts? Examine your breasts every month. If you are breastfeeding, the best time to examine your breasts is after a feeding or after using a breast pump. If you menstruate, the best time to examine your breasts is 5-7 days after your period is over. During your period, your breasts are lumpier, and it may be more difficult to notice changes. When should I see my health care provider? See your health care provider if you notice:  A change in shape or size of your breasts or nipples.  A change in the skin of your breast or nipples, such as a reddened or scaly area.  Unusual discharge from your nipples.  A lump or thick area that was not there before.  Pain in your breasts.  Anything that concerns you.  

## 2020-02-04 ENCOUNTER — Other Ambulatory Visit: Payer: Self-pay | Admitting: Internal Medicine

## 2020-03-26 DIAGNOSIS — K581 Irritable bowel syndrome with constipation: Secondary | ICD-10-CM | POA: Insufficient documentation

## 2020-04-29 ENCOUNTER — Telehealth: Payer: Self-pay | Admitting: Internal Medicine

## 2020-04-29 NOTE — Telephone Encounter (Signed)
April Houston pt calling, states she has a history of an anal fissure. Reports last night she went to the bathroom to have a BM and she passed some blood clots, it colored the toilet bowl red. This morning she went to the bathroom and passed more blood clots. Pt concerned. Discussed with her that with a fissure she should not be passing blood clots. Pt advised to go to the ER to be evaluated. Dr. Fuller Plan notified as DOD.

## 2020-06-09 ENCOUNTER — Other Ambulatory Visit: Payer: Self-pay | Admitting: Obstetrics and Gynecology

## 2020-06-09 DIAGNOSIS — N952 Postmenopausal atrophic vaginitis: Secondary | ICD-10-CM

## 2020-06-10 NOTE — Telephone Encounter (Signed)
Medication refill request: Betamethasone 0.1% Last AEX:  11/29/19 Next AEX: 12/04/20 Last MMG (if hormonal medication request): 06/23/19  Normal  Refill authorized: 30g/0

## 2020-06-18 ENCOUNTER — Ambulatory Visit: Payer: Self-pay | Admitting: Podiatry

## 2020-07-29 ENCOUNTER — Other Ambulatory Visit: Payer: Self-pay | Admitting: Obstetrics and Gynecology

## 2020-07-29 DIAGNOSIS — N952 Postmenopausal atrophic vaginitis: Secondary | ICD-10-CM

## 2020-07-29 NOTE — Telephone Encounter (Signed)
Please call and check on the patient. She just got 30 grams of valisone 2 months ago. She should be using small amounts (pea sized) not more than 2 x a week. If she has a flare she can increase to 2 x a day for up to 2 weeks. Typically a 30 gram tube should last a fairly long time. Refill was sent.

## 2020-07-29 NOTE — Telephone Encounter (Signed)
Medication refill request: Betamethasone 0.15 Last AEX:  11/29/19 Next AEX: 12/04/20 Last MMG (if hormonal medication request): NA Refill authorized: 30g/0

## 2020-07-30 NOTE — Telephone Encounter (Signed)
Patient is returning call.  °

## 2020-07-30 NOTE — Telephone Encounter (Signed)
Spoke with patient. Patient states she is applying a pea sized amount twice weekly. Advised per Dr. Talbert Nan. Patient verbalizes understanding and is agreeable.   Encounter closed.

## 2020-07-30 NOTE — Telephone Encounter (Signed)
Left message to call Frady Taddeo, RN at [facility] [phone].   

## 2020-08-20 ENCOUNTER — Ambulatory Visit: Payer: 59 | Admitting: Internal Medicine

## 2020-08-20 ENCOUNTER — Encounter: Payer: Self-pay | Admitting: Internal Medicine

## 2020-08-20 VITALS — BP 102/60 | HR 96 | Ht 64.0 in | Wt 137.0 lb

## 2020-08-20 DIAGNOSIS — K219 Gastro-esophageal reflux disease without esophagitis: Secondary | ICD-10-CM | POA: Diagnosis not present

## 2020-08-20 DIAGNOSIS — K649 Unspecified hemorrhoids: Secondary | ICD-10-CM | POA: Diagnosis not present

## 2020-08-20 DIAGNOSIS — K625 Hemorrhage of anus and rectum: Secondary | ICD-10-CM | POA: Diagnosis not present

## 2020-08-20 DIAGNOSIS — Q396 Congenital diverticulum of esophagus: Secondary | ICD-10-CM

## 2020-08-20 DIAGNOSIS — R490 Dysphonia: Secondary | ICD-10-CM

## 2020-08-20 DIAGNOSIS — R4702 Dysphasia: Secondary | ICD-10-CM

## 2020-08-20 MED ORDER — OMEPRAZOLE 40 MG PO CPDR: 40.0000 mg | DELAYED_RELEASE_CAPSULE | Freq: Two times a day (BID) | ORAL | 3 refills | Status: DC

## 2020-08-20 NOTE — Progress Notes (Signed)
HISTORY OF PRESENT ILLNESS:  April Houston is a 74 y.o. female who presents today for management of chronic GERD, new complaint of raspy voice, and episode of rectal bleeding.  Patient has chronic GERD and is status post fundoplication with redo fundoplication.  For prior problems with breakthrough symptoms her omeprazole was increased from 40 mg daily to 40 mg twice daily.  She does have chronic dysphagia.  Prior endoscopy revealed a small esophageal diverticulum.  Resistance at the gastroesophageal junction was dilated with 54 [place] Mal[place] dilator.  This improved majority of her issues with dysphagia.  She is also known to have gastroparesis.  Colonoscopy in 2015 with hyperplastic polyp only.  Currently experiencing breakthrough reflux symptoms, particularly in the morning.  She takes antacids on a regular basis.  This helps.  No significant dysphagia.  She continues on PPI twice daily.  Next, she reports protruding lesion from the rectum which "burst" with significant amounts of blood.  This was about 2 months ago.  Has not recurred.  No abdominal pain.  She typically has alternating bowel habits with a tendency toward constipation.  Had been managed with MiraLAX until its ineffectiveness.  Now treating herself with bran muffins and prunes.  Her bowel habits have regulated.  REVIEW OF SYSTEMS:  All non-GI ROS negative unless otherwise stated in the HPI except for anxiety, confusion, depression, fatigue, headaches, voice change (as mentioned above)   Past Medical History:  Diagnosis Date  . Anal fissure   . Anal pain   . Anemia   . Anxiety   . Blood transfusion without reported diagnosis 2002   After Nissen Fundiplication  . Cataract   . Chronic headaches   . Depression   . Endometriosis   . GERD (gastroesophageal reflux disease)   . Hiatal hernia   . Hypertension    no meds/ in past  . IBS (irritable bowel syndrome)   . Lichen sclerosus   . Pancreatitis   . Panic attacks   . Peptic  ulcer   . PVC (premature ventricular contraction)   . SOB (shortness of breath)    with panic attack and anxiety  . Wears glasses     Past Surgical History:  Procedure Laterality Date  . CATARACT EXTRACTION, BILATERAL     Bil/ In 2018  . LAPAROSCOPIC NISSEN FUNDOPLICATION  6606   and Repair Hiatal Hernia  . LIPOMA EXCISION    . RECTAL EXAM UNDER ANESTHESIA N/A 12/06/2015   Procedure: ANAL  EXAM UNDER ANESTHESIA, BIOPSY;  Surgeon: Leighton Ruff, MD;  Location: [place];  Service: General;  Laterality: N/A;  . TONSILLECTOMY  age 71  . TOTAL ABDOMINAL HYSTERECTOMY W/ BILATERAL SALPINGOOPHORECTOMY  1992    Social History April Houston  reports that she has never smoked. She has never used smokeless tobacco. She reports previous alcohol use. She reports that she does not use drugs.  family history includes Cystic fibrosis in an other family member; Diabetes in her mother; Hyperlipidemia in her mother; Hypertension in her mother; Irritable bowel syndrome in her mother, sister, and sister; Kidney cancer in her mother; Lung cancer in her father; Stroke in her mother.  Allergies  Allergen Reactions  . Hydrocodone Itching    severe  . Other Other (See Comments)    Severe headaches w/ most gerd medication IV and PO   . Zantac [Ranitidine Hcl] Other (See Comments)    headaches       PHYSICAL EXAMINATION: Vital signs: BP 102/60  Pulse 96   Ht 5\' 4"  (1.626 m)   Wt 137 lb (62.1 kg)   BMI 23.52 kg/m   Constitutional: generally well-appearing, no acute distress Psychiatric: alert and oriented x3, cooperative Eyes: extraocular movements intact, anicteric, conjunctiva pink Mouth: oral pharynx moist, no lesions Neck: supple no lymphadenopathy Cardiovascular: heart regular rate and rhythm, no murmur Lungs: clear to auscultation bilaterally Abdomen: soft, nontender, nondistended, no obvious ascites, no peritoneal signs, normal bowel sounds, no organomegaly Rectal: No  external abnormalities.  Moderate internal hemorrhoids Extremities: no clubbing, cyanosis, or lower extremity edema bilaterally Skin: no lesions on visible extremities Neuro: No focal deficits.  Cranial nerves intact  ASSESSMENT:  1.  Chronic GERD.  Status post fundoplication with redo fundoplication.  Breakthrough symptoms on twice daily PPI. 2.  Dysphagia.  Improved post dilation.  Evidence of dysmotility as evidenced by the presence of esophageal diverticulum. 3.  Raspy voice.  May be related to reflux.  Deserves formal ENT evaluation, to be certain. 4.  Patient rectal bleeding secondary to prolapsed hemorrhoid.  Resolved 5.  Colonoscopy 2015 with hyperplastic polyp only   PLAN:  1.  Reflux precautions 2.  Continue twice daily PPI.  Refill 3.  On-demand antacids 4.  Advised to have PCP refer her to ENT.  She understands and agrees. 5.  Keep bowels regulated, as you are. 6.  Routine office follow-up 1 year 7.  Routine follow-up colonoscopy 2025 to be considered

## 2020-08-20 NOTE — Patient Instructions (Signed)
We have sent the following medications to your pharmacy for you to pick up at your convenience:  Omeprazole.  Continue fiber and reflux medication   Have your PCP refer you to an ENT for hoarseness

## 2020-09-05 DIAGNOSIS — K219 Gastro-esophageal reflux disease without esophagitis: Secondary | ICD-10-CM | POA: Insufficient documentation

## 2020-10-01 DIAGNOSIS — E7800 Pure hypercholesterolemia, unspecified: Secondary | ICD-10-CM | POA: Insufficient documentation

## 2020-10-28 ENCOUNTER — Ambulatory Visit: Payer: 59 | Admitting: Internal Medicine

## 2020-12-02 NOTE — Progress Notes (Signed)
75 y.o. G0P0000 Divorced Radde or Caucasian Not Hispanic or Latino female here for annual exam.  H/O TAH/BSO.  She is sexually active, same partner x 1.5 years. No pain with vaginal estrogen. No vaginal bleeding. No bladder c/o.   H/O vaginal atrophy, dyspareunia and lichen sclerosis.  She uses vaginal estrogen and topical steroids.   She has intermittent vulvar itching. Uses the steroid ointment 2 x a week.   H/o anal fissures. She is controlling constipation with diet.  She sometimes gets knots on her perineum. Not hemorrhoids, not sure what it is. Told her to come in when present.    h/o morphea and a perianal lesion, dermatologist thought it was a nevus.   Her depression is awful. She see's a Teacher, music. She has to work.     No LMP recorded. Patient has had a hysterectomy.          Sexually active: No.  The current method of family planning is post menopausal status.    Exercising: Yes.    walking  Smoker:  no  Health Maintenance: Pap:  Years ago  History of abnormal Pap:  no MMG:  Mammogram last summer (will get report),06/15/19 incomplete, 06/23/19 f/u imaging benign  BMD:   06/15/19 low bone mass T-Score -1.3, improved, FRAX 8.8/1.1% Colonoscopy:2017 polyp - repeat in 10 yrs  TDaP:  2015 Gardasil: NA   reports that she has never smoked. She has never used smokeless tobacco. She reports previous alcohol use. She reports that she does not use drugs. She is an Surveyor, minerals, works for the city of [place]. She has a dog she loves. Her sister left her 5 cats at her house (she doesn't want the cats).   Past Medical History:  Diagnosis Date  . Anal fissure   . Anal pain   . Anemia   . Anxiety   . Blood transfusion without reported diagnosis 2002   After Nissen Fundiplication  . Cataract   . Chronic headaches   . Depression   . Endometriosis   . GERD (gastroesophageal reflux disease)   . Hiatal hernia   . Hypertension    no meds/ in past  . IBS (irritable bowel  syndrome)   . Lichen sclerosus   . Pancreatitis   . Panic attacks   . Peptic ulcer   . PVC (premature ventricular contraction)   . SOB (shortness of breath)    with panic attack and anxiety  . Wears glasses     Past Surgical History:  Procedure Laterality Date  . CATARACT EXTRACTION, BILATERAL     Bil/ In 2018  . LAPAROSCOPIC NISSEN FUNDOPLICATION  5277   and Repair Hiatal Hernia  . LIPOMA EXCISION    . RECTAL EXAM UNDER ANESTHESIA N/A 12/06/2015   Procedure: ANAL  EXAM UNDER ANESTHESIA, BIOPSY;  Surgeon: Leighton Ruff, MD;  Location: [facility];  Service: General;  Laterality: N/A;  . TONSILLECTOMY  age 79  . TOTAL ABDOMINAL HYSTERECTOMY W/ BILATERAL SALPINGOOPHORECTOMY  1992    Current Outpatient Medications  Medication Sig Dispense Refill  . ALPRAZolam (XANAX) 1 MG tablet Take 1 tab 3 times a day    . augmented betamethasone dipropionate (DIPROLENE-AF) 0.05 % ointment Apply topically 2 (two) times daily.    . betamethasone valerate ointment (VALISONE) 0.1 % APPLY A PEA SIZED AMOUNT TOPICALLY TWICE A DAY FOR UP TO 1 TO 2 WEEKS AS NEEDED. CAN USE 1 TO 2 TIMES A WEEK AS MAINTENACE IF NEEDED. NOT FOR DAILY  LONG TERM USE 30 g 0  . calcium carbonate (TUMS - DOSED IN MG ELEMENTAL CALCIUM) 500 MG chewable tablet Chew 1 tablet by mouth daily.    Marland Kitchen Desvenlafaxine Succinate (PRISTIQ PO) Take 1 tablet by mouth daily.     Marland Kitchen docusate sodium (COLACE) 100 MG capsule Take 100 mg by mouth daily.    . Estradiol (YUVAFEM) 10 MCG TABS vaginal tablet Place 1 TABLET vaginally 2 TIMES A WEEK 24 tablet 3  . hydrOXYzine (ATARAX/VISTARIL) 25 MG tablet Take 1 tab tid for anxiety (dose changed)    . omeprazole (PRILOSEC) 40 MG capsule Take 1 capsule (40 mg total) by mouth 2 (two) times daily. 180 capsule 3  . OVER THE COUNTER MEDICATION OTC EMUAID MAX- apply to vaginal and rectal area PRN    . polyethylene glycol powder (GLYCOLAX/MIRALAX) powder Take 17 g by mouth 2 (two) times daily. 255 g  0  . psyllium (METAMUCIL) 58.6 % powder Take 1 packet by mouth daily.    . Multiple Vitamins-Minerals (PRESERVISION AREDS 2) CAPS Take 1 capsule by mouth 2 (two) times daily.    . QUEtiapine (SEROQUEL) 25 MG tablet Take by mouth.    . valACYclovir (VALTREX) 500 MG tablet Take 500 mg by mouth 2 (two) times daily.     No current facility-administered medications for this visit.    Family History  Problem Relation Age of Onset  . Hypertension Mother   . Hyperlipidemia Mother   . Stroke Mother   . Diabetes Mother   . Irritable bowel syndrome Mother   . Kidney cancer Mother   . Lung cancer Father   . Irritable bowel syndrome Sister   . Cystic fibrosis Other   . Irritable bowel syndrome Sister   . Esophageal cancer Neg Hx   . Stomach cancer Neg Hx   . Rectal cancer Neg Hx   . Colon cancer Neg Hx     Review of Systems  Psychiatric/Behavioral: Positive for dysphoric mood. The patient is nervous/anxious.   All other systems reviewed and are negative.   Exam:   Pulse 86   Ht 5\' 4"  (1.626 m)   Wt 141 lb 3.2 oz (64 kg)   SpO2 99%   BMI 24.24 kg/m   Weight change: @WEIGHTCHANGE @ Height:   Height: 5\' 4"  (162.6 cm)  Ht Readings from Last 3 Encounters:  12/04/20 5\' 4"  (1.626 m)  08/20/20 5\' 4"  (1.626 m)  11/29/19 5\' 4"  (1.626 m)    General appearance: alert, cooperative and appears stated age Head: Normocephalic, without obvious abnormality, atraumatic Neck: no adenopathy, supple, symmetrical, trachea midline and thyroid normal to inspection and palpation Lungs: clear to auscultation bilaterally Cardiovascular: regular rate and rhythm Breasts: normal appearance, no masses or tenderness Abdomen: soft, non-tender; non distended,  no masses,  no organomegaly Extremities: extremities normal, atraumatic, no cyanosis or edema Skin: Skin color, texture, turgor normal. No rashes or lesions Lymph nodes: Cervical, supraclavicular, and axillary nodes normal. No abnormal inguinal nodes  palpated Neurologic: Grossly normal   Pelvic: External genitalia:  Mild whitening, mild loss of architecture              Urethra:  normal appearing urethra with no masses, tenderness or lesions              Bartholins and Skenes: normal                 Vagina: normal appearing vagina with normal color and discharge, no lesions  Cervix: absent               Bimanual Exam:  Uterus:  uterus absent              Adnexa: no mass, fullness, tenderness               Rectovaginal: Confirms               Anus:  normal sphincter tone, no lesions  Perianal region: diffuse whitening  Gae Dry chaperoned for the exam.  1. Well woman exam Discussed breast self exam Discussed calcium and vit D intake Mammogram normal per patient last summer Colonoscopy is UTD NO pap is needed.   2. Vaginal atrophy Continue vaginal estrogen.  - Estradiol (YUVAFEM) 10 MCG TABS vaginal tablet; Place 1 TABLET vaginally 2 TIMES A WEEK  Dispense: 24 tablet; Refill: 3  3. Lichen sclerosus Stable - betamethasone valerate ointment (VALISONE) 0.1 %; Use a pea sized amount topically 2 x a week. Can increase to 2 x a day for up to a week as needed.  Dispense: 30 g; Refill: 0  4. History of osteopenia Due for DEXA in 8/22, order sent to [facility].

## 2020-12-04 ENCOUNTER — Encounter: Payer: Self-pay | Admitting: Obstetrics and Gynecology

## 2020-12-04 ENCOUNTER — Other Ambulatory Visit: Payer: Self-pay

## 2020-12-04 ENCOUNTER — Ambulatory Visit: Payer: 59 | Admitting: Obstetrics and Gynecology

## 2020-12-04 VITALS — HR 86 | Ht 64.0 in | Wt 141.2 lb

## 2020-12-04 DIAGNOSIS — R12 Heartburn: Secondary | ICD-10-CM | POA: Insufficient documentation

## 2020-12-04 DIAGNOSIS — G43909 Migraine, unspecified, not intractable, without status migrainosus: Secondary | ICD-10-CM | POA: Insufficient documentation

## 2020-12-04 DIAGNOSIS — N952 Postmenopausal atrophic vaginitis: Secondary | ICD-10-CM | POA: Diagnosis not present

## 2020-12-04 DIAGNOSIS — Z01419 Encounter for gynecological examination (general) (routine) without abnormal findings: Secondary | ICD-10-CM | POA: Diagnosis not present

## 2020-12-04 DIAGNOSIS — Z8739 Personal history of other diseases of the musculoskeletal system and connective tissue: Secondary | ICD-10-CM | POA: Diagnosis not present

## 2020-12-04 DIAGNOSIS — L900 Lichen sclerosus et atrophicus: Secondary | ICD-10-CM

## 2020-12-04 MED ORDER — ESTRADIOL 10 MCG VA TABS: ORAL_TABLET | VAGINAL | 3 refills | Status: DC

## 2020-12-04 MED ORDER — BETAMETHASONE VALERATE 0.1 % EX OINT: TOPICAL_OINTMENT | CUTANEOUS | 0 refills | Status: DC

## 2020-12-04 NOTE — Patient Instructions (Signed)

## 2020-12-24 ENCOUNTER — Emergency Department (HOSPITAL_COMMUNITY)
Admission: EM | Admit: 2020-12-24 | Discharge: 2020-12-24 | Disposition: A | Discharge status: Home / Self Care | Payer: 59 | Attending: Emergency Medicine | Admitting: Emergency Medicine

## 2020-12-24 ENCOUNTER — Other Ambulatory Visit: Payer: Self-pay

## 2020-12-24 ENCOUNTER — Emergency Department (HOSPITAL_COMMUNITY): Payer: 59

## 2020-12-24 ENCOUNTER — Encounter (HOSPITAL_COMMUNITY): Payer: Self-pay

## 2020-12-24 DIAGNOSIS — I10 Essential (primary) hypertension: Secondary | ICD-10-CM | POA: Insufficient documentation

## 2020-12-24 DIAGNOSIS — R0789 Other chest pain: Secondary | ICD-10-CM | POA: Insufficient documentation

## 2020-12-24 DIAGNOSIS — R079 Chest pain, unspecified: Secondary | ICD-10-CM | POA: Diagnosis present

## 2020-12-24 HISTORY — DX: Bradycardia, unspecified: R00.1

## 2020-12-24 LAB — CBC WITH DIFFERENTIAL/PLATELET
Abs Immature Granulocytes: 0.03 10*3/uL (ref 0.00–0.07)
Basophils Absolute: 0.1 10*3/uL (ref 0.0–0.1)
Basophils Relative: 1 %
Eosinophils Absolute: 0.2 10*3/uL (ref 0.0–0.5)
Eosinophils Relative: 3 %
HCT: 36.7 % (ref 36.0–46.0)
Hemoglobin: 12.6 g/dL (ref 12.0–15.0)
Immature Granulocytes: 0 %
Lymphocytes Relative: 35 %
Lymphs Abs: 2.5 10*3/uL (ref 0.7–4.0)
MCH: 30.8 pg (ref 26.0–34.0)
MCHC: 34.3 g/dL (ref 30.0–36.0)
MCV: 89.7 fL (ref 80.0–100.0)
Monocytes Absolute: 0.6 10*3/uL (ref 0.1–1.0)
Monocytes Relative: 8 %
Neutro Abs: 3.8 10*3/uL (ref 1.7–7.7)
Neutrophils Relative %: 53 %
Platelets: 277 10*3/uL (ref 150–400)
RBC: 4.09 MIL/uL (ref 3.87–5.11)
RDW: 13 % (ref 11.5–15.5)
WBC: 7.1 10*3/uL (ref 4.0–10.5)
nRBC: 0 % (ref 0.0–0.2)

## 2020-12-24 LAB — COMPREHENSIVE METABOLIC PANEL
ALT: 19 U/L (ref 0–44)
AST: 17 U/L (ref 15–41)
Albumin: 4 g/dL (ref 3.5–5.0)
Alkaline Phosphatase: 56 U/L (ref 38–126)
Anion gap: 8 (ref 5–15)
BUN: 18 mg/dL (ref 8–23)
CO2: 20 mmol/L — ABNORMAL LOW (ref 22–32)
Calcium: 9.1 mg/dL (ref 8.9–10.3)
Chloride: 106 mmol/L (ref 98–111)
Creatinine, Ser: 1.33 mg/dL — ABNORMAL HIGH (ref 0.44–1.00)
GFR, Estimated: 43 mL/min — ABNORMAL LOW (ref >60)
Glucose, Bld: 96 mg/dL (ref 70–99)
Potassium: 4 mmol/L (ref 3.5–5.1)
Sodium: 134 mmol/L — ABNORMAL LOW (ref 135–145)
Total Bilirubin: 0.8 mg/dL (ref 0.3–1.2)
Total Protein: 6.3 g/dL — ABNORMAL LOW (ref 6.5–8.1)

## 2020-12-24 LAB — PROTIME-INR
INR: 1.1 (ref 0.8–1.2)
Prothrombin Time: 13.8 seconds (ref 11.4–15.2)

## 2020-12-24 LAB — URINALYSIS, ROUTINE W REFLEX MICROSCOPIC
Bacteria, UA: NONE SEEN
Bilirubin Urine: NEGATIVE
Glucose, UA: NEGATIVE mg/dL
Hgb urine dipstick: NEGATIVE
Ketones, ur: NEGATIVE mg/dL
Nitrite: NEGATIVE
Protein, ur: NEGATIVE mg/dL
Specific Gravity, Urine: 1.004 — ABNORMAL LOW (ref 1.005–1.030)
pH: 5 (ref 5.0–8.0)

## 2020-12-24 LAB — TROPONIN I (HIGH SENSITIVITY)
Troponin I (High Sensitivity): 3 ng/L (ref <18)
Troponin I (High Sensitivity): 2 ng/L (ref <18)

## 2020-12-24 LAB — D-DIMER, QUANTITATIVE: D-Dimer, Quant: 0.38 ug/mL-FEU (ref 0.00–0.50)

## 2020-12-24 LAB — LIPASE, BLOOD: Lipase: 32 U/L (ref 11–51)

## 2020-12-24 MED ORDER — TRAMADOL HCL 50 MG PO TABS: ORAL_TABLET | ORAL | 0 refills | Status: DC

## 2020-12-24 MED ORDER — PANTOPRAZOLE SODIUM 40 MG IV SOLR
40.0000 mg | Freq: Once | INTRAVENOUS | Status: AC
  Administered 2020-12-24: 40 mg via INTRAVENOUS
  Filled 2020-12-24: qty 40

## 2020-12-24 MED ORDER — MORPHINE SULFATE (PF) 2 MG/ML IV SOLN
2.0000 mg | Freq: Once | INTRAVENOUS | Status: AC
Start: 2020-12-24 — End: 2020-12-24
  Administered 2020-12-24: 2 mg via INTRAVENOUS
  Filled 2020-12-24: qty 1

## 2020-12-24 NOTE — ED Provider Notes (Signed)
[facility] EMERGENCY DEPARTMENT Provider Note   CSN: [id number] Arrival date & time: 12/24/20  4235     History Chief Complaint  Patient presents with  . Flank Pain    April Houston is a 75 y.o. female.  HPI Patient reports she was out walking her dog.  She had been feeling well when she went out.  She reports was in the walk she started to get a sharp pain in her left chest under the breast.  She reports that it was sharp and radiating somewhat towards her left flank.  Pain was made worse by certain positions and deep breaths.  Patient denies she is ever had a similar pain.  She did not feel lightheaded.  She has not had a cough or fever.  She has not had lower extremity swelling or calf pain.  No prior problems with exertional dyspnea or shortness of breath.  She reports she very briefly felt mildly nauseated but that resolved.  She is not experiencing any abdominal pain.    Past Medical History:  Diagnosis Date  . Anal fissure   . Anal pain   . Anemia   . Anxiety   . Blood transfusion without reported diagnosis 2002   After Nissen Fundiplication  . Bradycardia   . Cataract   . Chronic headaches   . Depression   . Endometriosis   . GERD (gastroesophageal reflux disease)   . Hiatal hernia   . Hypertension    no meds/ in past  . IBS (irritable bowel syndrome)   . Lichen sclerosus   . Pancreatitis   . Panic attacks   . Peptic ulcer   . PVC (premature ventricular contraction)   . SOB (shortness of breath)    with panic attack and anxiety  . Wears glasses     Patient Active Problem List   Diagnosis Date Noted  . Heartburn 12/04/2020  . Migraines 12/04/2020  . Pure hypercholesterolemia 10/01/2020  . Laryngopharyngeal reflux (LPR) 09/05/2020  . Irritable bowel syndrome with constipation 03/26/2020  . Hair loss 10/31/2019  . Lipid screening 04/14/2019  . Malaise 04/14/2019  . 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  Gastroesophageal reflux disease without esophagitis 02/10/2018  . Vitamin D deficiency 02/10/2018  . Pain of left lower extremity 09/15/2017  . Phlebitis 08/12/2017  . Panic disorder 05/30/2015  . Persistent insomnia 01/22/2014  . Hyperlipidemia 12/02/2013  . Mixed emotional features as adjustment reaction 12/02/2013    Past Surgical History:  Procedure Laterality Date  . CATARACT EXTRACTION, BILATERAL     Bil/ In 2018  . LAPAROSCOPIC NISSEN FUNDOPLICATION  3614   and Repair Hiatal Hernia  . LIPOMA EXCISION    . RECTAL EXAM UNDER ANESTHESIA N/A 12/06/2015   Procedure: ANAL  EXAM UNDER ANESTHESIA, BIOPSY;  Surgeon: Leighton Ruff, MD;  Location: [facility];  Service: General;  Laterality: N/A;  . TONSILLECTOMY  age 13  . TOTAL ABDOMINAL HYSTERECTOMY W/ BILATERAL SALPINGOOPHORECTOMY  1992     OB History    Gravida  0   Para  0   Term  0   Preterm  0   AB  0   Living  0     SAB  0   IAB  0   Ectopic  0   Multiple  0   Live Births  0           Family History  Problem Relation Age of Onset  .  Hypertension Mother   . Hyperlipidemia Mother   . Stroke Mother   . Diabetes Mother   . Irritable bowel syndrome Mother   . Kidney cancer Mother   . Lung cancer Father   . Irritable bowel syndrome Sister   . Cystic fibrosis Other   . Irritable bowel syndrome Sister   . Esophageal cancer Neg Hx   . Stomach cancer Neg Hx   . Rectal cancer Neg Hx   . Colon cancer Neg Hx     Social History   Tobacco Use  . Smoking status: Never Smoker  . Smokeless tobacco: Never Used  Vaping Use  . Vaping Use: Never used  Substance Use Topics  . Alcohol use: Not Currently  . Drug use: No    Home Medications Prior to Admission medications   Medication Sig Start Date End Date Taking? Authorizing Provider  traMADol (ULTRAM) 50 MG tablet 1-2 tablets every 6 hours as needed for pain 12/24/20  Yes Micharl Helmes, Jeannie Done, MD  ALPRAZolam Duanne Moron) 1 MG tablet Take 1 tab 3  times a day 06/01/17   [provider]  augmented betamethasone dipropionate (DIPROLENE-AF) 0.05 % ointment Apply topically 2 (two) times daily.    [provider]  betamethasone valerate ointment (VALISONE) 0.1 % Use a pea sized amount topically 2 x a week. Can increase to 2 x a day for up to a week as needed. 12/04/20   Salvadore Dom, MD  calcium carbonate (TUMS - DOSED IN MG ELEMENTAL CALCIUM) 500 MG chewable tablet Chew 1 tablet by mouth daily.    [provider]  Desvenlafaxine Succinate (PRISTIQ PO) Take 1 tablet by mouth daily.     [provider]  docusate sodium (COLACE) 100 MG capsule Take 100 mg by mouth daily.    [provider]  Estradiol (YUVAFEM) 10 MCG TABS vaginal tablet Place 1 TABLET vaginally 2 TIMES A WEEK 12/04/20   Salvadore Dom, MD  hydrOXYzine (ATARAX/VISTARIL) 25 MG tablet Take 1 tab tid for anxiety (dose changed) 05/25/18   [provider]  Multiple Vitamins-Minerals (PRESERVISION AREDS 2) CAPS Take 1 capsule by mouth 2 (two) times daily. 11/25/20   [provider]  omeprazole (PRILOSEC) 40 MG capsule Take 1 capsule (40 mg total) by mouth 2 (two) times daily. 08/20/20   Irene Shipper, MD  OVER THE COUNTER MEDICATION OTC EMUAID MAX- apply to vaginal and rectal area PRN    [provider]  polyethylene glycol powder (GLYCOLAX/MIRALAX) powder Take 17 g by mouth 2 (two) times daily. 06/27/17   Waynetta Pean, PA-C  psyllium (METAMUCIL) 58.6 % powder Take 1 packet by mouth daily.    [provider]  QUEtiapine (SEROQUEL) 25 MG tablet Take by mouth. 07/31/20   [provider]  valACYclovir (VALTREX) 500 MG tablet Take 500 mg by mouth 2 (two) times daily. 07/29/20   [provider]    Allergies    Hydrocodone, Other, and Zantac [ranitidine hcl]  Review of Systems   Review of Systems 10 systems reviewed and negative except as per HPI Physical Exam Updated Vital Signs BP  115/72   Pulse 69   Temp 98.5 F (36.9 C) (Oral)   Resp 19   SpO2 100%   Physical Exam Constitutional:      Appearance: She is well-developed and well-nourished.  HENT:     Head: Normocephalic and atraumatic.     Mouth/Throat:     Pharynx: Oropharynx is clear.  Eyes:  Extraocular Movements: Extraocular movements intact and EOM normal.  Cardiovascular:     Rate and Rhythm: Normal rate and regular rhythm.     Pulses: Intact distal pulses.     Heart sounds: Normal heart sounds.  Pulmonary:     Effort: Pulmonary effort is normal.     Breath sounds: Normal breath sounds.  Abdominal:     General: Bowel sounds are normal. There is no distension.     Palpations: Abdomen is soft.     Tenderness: There is no abdominal tenderness.     Comments: No rash.  No soft tissue changes.  Musculoskeletal:        General: No swelling, tenderness or edema. Normal range of motion.     Cervical back: Neck supple.     Right lower leg: No edema.     Left lower leg: No edema.  Skin:    General: Skin is warm, dry and intact.  Neurological:     Mental Status: She is alert and oriented to person, place, and time.     GCS: GCS eye subscore is 4. GCS verbal subscore is 5. GCS motor subscore is 6.     Coordination: Coordination normal.     Deep Tendon Reflexes: Strength normal.  Psychiatric:        Mood and Affect: Mood and affect and mood normal.     ED Results / Procedures / Treatments   Labs (all labs ordered are listed, but only abnormal results are displayed) Labs Reviewed  COMPREHENSIVE METABOLIC PANEL - Abnormal; Notable for the following components:      Result Value   Sodium 134 (*)    CO2 20 (*)    Creatinine, Ser 1.33 (*)    Total Protein 6.3 (*)    GFR, Estimated 43 (*)    All other components within normal limits  LIPASE, BLOOD  CBC WITH DIFFERENTIAL/PLATELET  PROTIME-INR  D-DIMER, QUANTITATIVE  URINALYSIS, ROUTINE W REFLEX MICROSCOPIC  TROPONIN I (HIGH SENSITIVITY)   TROPONIN I (HIGH SENSITIVITY)    EKG EKG Interpretation  Date/Time:  Tuesday December 24 2020 09:50:55 EST Ventricular Rate:  70 PR Interval:    QRS Duration: 89 QT Interval:  393 QTC Calculation: 424 R Axis:   44 Text Interpretation: Sinus rhythm normal, no change from previous Confirmed by Charlesetta Shanks [phone]) on 12/24/2020 10:33:28 AM   Radiology DG Chest 2 View  Result Date: 12/24/2020 CLINICAL DATA:  Pain under the left breast and left flank pain after walking the dog. EXAM: CHEST - 2 VIEW COMPARISON:  Chest x-ray dated December 28, 2011. FINDINGS: The heart size and mediastinal contours are within normal limits. Normal pulmonary vascularity. Low lung volumes. Unchanged biapical pleuroparenchymal scarring. No focal consolidation, pleural effusion, or pneumothorax. No acute osseous abnormality. IMPRESSION: No active cardiopulmonary disease. Electronically Signed   By: Titus Dubin M.D.   On: 12/24/2020 13:41    Procedures Procedures   Medications Ordered in ED Medications  pantoprazole (PROTONIX) injection 40 mg (40 mg Intravenous Given 12/24/20 1043)  morphine 2 MG/ML injection 2 mg (2 mg Intravenous Given 12/24/20 1043)    ED Course  I have reviewed the triage vital signs and the nursing notes.  Pertinent labs & imaging results that were available during my care of the patient were reviewed by me and considered in my medical decision making (see chart for details).    MDM Rules/Calculators/A&P  Patient developed a sudden, focal area of pain just below the left breast on the left side of the chest wall.  2 sets of troponins are negative.  D-dimer is negative.  Chest x-ray is clear.  Patient has not had any constitutional symptoms.  Pain has been constant.  This is been improved after Protonix and 2 mg of morphine.  At this time, suspect musculoskeletal chest wall pain.  Patient has been counseled on watching for any rash to suggest shingles.  Return  precautions for fever, shortness of breath, worsening pain or other concerning symptoms reviewed.  Patient will try 1-2 tramadol for pain control.  She is to have recheck with PCP within 3 to 5 days. Final Clinical Impression(s) / ED Diagnoses Final diagnoses:  Atypical chest pain    Rx / DC Orders ED Discharge Orders         Ordered    traMADol (ULTRAM) 50 MG tablet        12/24/20 1400           Charlesetta Shanks, MD 12/24/20 1404

## 2020-12-24 NOTE — ED Notes (Signed)
Patient transported to X-ray 

## 2020-12-24 NOTE — ED Triage Notes (Addendum)
Pt from home BIB EMS for c/o pain under left breast radiating to left flank that started after walking dog. Pain is worse with breathing and movement. Hx of GERD; pt states this pain is different than GERD pain. Pt reports some nausea; denies ABD pain/CP/SOB. Pt reports some urinary frequency and chronic issues with a burning sensation when urinating.

## 2020-12-24 NOTE — Discharge Instructions (Signed)
1.  See your doctor for recheck within 3 to 5 days. 2.  Return to the emergency department if you develop a fever, shortness of breath or any worsening concerning symptoms. 3.  You may try a tramadol tablet for pain relief.  If needed, you may take 2 tablets every 6 hours.  Try to decrease your dose as soon as pain is tolerable.

## 2020-12-24 NOTE — ED Notes (Signed)
Patient is resting comfortably with eyes closed at this time.

## 2021-01-20 ENCOUNTER — Other Ambulatory Visit: Payer: Self-pay

## 2021-01-20 ENCOUNTER — Encounter: Payer: Self-pay | Admitting: Obstetrics and Gynecology

## 2021-01-20 ENCOUNTER — Ambulatory Visit: Payer: 59 | Admitting: Obstetrics and Gynecology

## 2021-01-20 VITALS — BP 122/64 | HR 63 | Ht 64.0 in | Wt 143.0 lb

## 2021-01-20 DIAGNOSIS — N941 Unspecified dyspareunia: Secondary | ICD-10-CM | POA: Diagnosis not present

## 2021-01-20 DIAGNOSIS — R351 Nocturia: Secondary | ICD-10-CM

## 2021-01-20 DIAGNOSIS — L900 Lichen sclerosus et atrophicus: Secondary | ICD-10-CM

## 2021-01-20 DIAGNOSIS — N952 Postmenopausal atrophic vaginitis: Secondary | ICD-10-CM | POA: Diagnosis not present

## 2021-01-20 MED ORDER — LIDOCAINE 5 % EX OINT: TOPICAL_OINTMENT | CUTANEOUS | 0 refills | Status: DC

## 2021-01-20 MED ORDER — ESTRADIOL 10 MCG VA TABS: ORAL_TABLET | VAGINAL | 0 refills | Status: DC

## 2021-01-20 NOTE — Progress Notes (Signed)
GYNECOLOGY  VISIT   HPI: 75 y.o.   Divorced [place] or Caucasian Not Hispanic or Latino  female   [id number] with No LMP recorded. Patient has had a hysterectomy.   here for 2 places in her vaginal area that hurt with penetration. She states that she is having urinary frequency at night only.   She is on vaginal estrogen for atrophy. She has a h/o lichen sclerosis uses steroid ointment 2 x a week.   She c/o increasing pain with penetration at the opening of her vagina, just in 2 spots (where she had prior biopsies for her lichen sclerosis). Same partner. No problem after penetration. She can't see anything.   She c/o a 2 month h/o increased nocturia with urgency. Occurring 5-6 x a night. Voiding normal amounts. No leakage. During the day she is fine. Only voids 3 x during the day.  She drinks ~6 oz of milk at bedtime. No ETOH. Has a big cup of water at 5 o'clock, then the glass of milk at 7 pm, goes to bed at 7 am. She has 2 teabags early in the morning.   The need to void wakes her up.   GYNECOLOGIC HISTORY: No LMP recorded. Patient has had a hysterectomy. Contraception:PMP Menopausal hormone therapy: none         OB History    Gravida  0   Para  0   Term  0   Preterm  0   AB  0   Living  0     SAB  0   IAB  0   Ectopic  0   Multiple  0   Live Births  0              Patient Active Problem List   Diagnosis Date Noted  . Heartburn 12/04/2020  . Migraines 12/04/2020  . Pure hypercholesterolemia 10/01/2020  . Laryngopharyngeal reflux (LPR) 09/05/2020  . Irritable bowel syndrome with constipation 03/26/2020  . Hair loss 10/31/2019  . Lipid screening 04/14/2019  . Malaise 04/14/2019  . 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 Gastroesophageal reflux disease without esophagitis 02/10/2018  . Vitamin D deficiency 02/10/2018  . Pain of left lower extremity 09/15/2017  . Phlebitis 08/12/2017  . Panic disorder 05/30/2015  . Persistent insomnia  01/22/2014  . Hyperlipidemia 12/02/2013  . Mixed emotional features as adjustment reaction 12/02/2013    Past Medical History:  Diagnosis Date  . Anal fissure   . Anal pain   . Anemia   . Anxiety   . Blood transfusion without reported diagnosis 2002   After Nissen Fundiplication  . Bradycardia   . Cataract   . Chronic headaches   . Depression   . Endometriosis   . GERD (gastroesophageal reflux disease)   . Hiatal hernia   . Hypertension    no meds/ in past  . IBS (irritable bowel syndrome)   . Lichen sclerosus   . Pancreatitis   . Panic attacks   . Peptic ulcer   . PVC (premature ventricular contraction)   . SOB (shortness of breath)    with panic attack and anxiety  . Wears glasses     Past Surgical History:  Procedure Laterality Date  . CATARACT EXTRACTION, BILATERAL     Bil/ In 2018  . LAPAROSCOPIC NISSEN FUNDOPLICATION  2119   and Repair Hiatal Hernia  . LIPOMA EXCISION    . RECTAL EXAM UNDER ANESTHESIA N/A 12/06/2015   Procedure: ANAL  EXAM  UNDER ANESTHESIA, BIOPSY;  Surgeon: Leighton Ruff, MD;  Location: [facility];  Service: General;  Laterality: N/A;  . TONSILLECTOMY  age 11  . TOTAL ABDOMINAL HYSTERECTOMY W/ BILATERAL SALPINGOOPHORECTOMY  1992    Current Outpatient Medications  Medication Sig Dispense Refill  . ALPRAZolam (XANAX) 1 MG tablet Take 1 tab 3 times a day    . augmented betamethasone dipropionate (DIPROLENE-AF) 0.05 % ointment Apply topically 2 (two) times daily.    . betamethasone valerate ointment (VALISONE) 0.1 % Use a pea sized amount topically 2 x a week. Can increase to 2 x a day for up to a week as needed. 30 g 0  . calcium carbonate (TUMS - DOSED IN MG ELEMENTAL CALCIUM) 500 MG chewable tablet Chew 1 tablet by mouth daily.    Marland Kitchen docusate sodium (COLACE) 100 MG capsule Take 100 mg by mouth daily.    . Estradiol (YUVAFEM) 10 MCG TABS vaginal tablet Place 1 TABLET vaginally 2 TIMES A WEEK 24 tablet 3  . FLUoxetine (PROZAC)  10 MG capsule Take 1 cap twice a day    . hydrOXYzine (ATARAX/VISTARIL) 25 MG tablet Take 1 tab tid for anxiety (dose changed)    . Multiple Vitamins-Minerals (PRESERVISION AREDS 2) CAPS Take 1 capsule by mouth 2 (two) times daily.    Marland Kitchen omeprazole (PRILOSEC) 40 MG capsule Take 1 capsule (40 mg total) by mouth 2 (two) times daily. 180 capsule 3  . OVER THE COUNTER MEDICATION OTC EMUAID MAX- apply to vaginal and rectal area PRN    . polyethylene glycol powder (GLYCOLAX/MIRALAX) powder Take 17 g by mouth 2 (two) times daily. 255 g 0  . psyllium (METAMUCIL) 58.6 % powder Take 1 packet by mouth daily.    . valACYclovir (VALTREX) 500 MG tablet Take 500 mg by mouth 2 (two) times daily.     No current facility-administered medications for this visit.     ALLERGIES: Hydrocodone, Other, and Zantac [ranitidine hcl]  Family History  Problem Relation Age of Onset  . Hypertension Mother   . Hyperlipidemia Mother   . Stroke Mother   . Diabetes Mother   . Irritable bowel syndrome Mother   . Kidney cancer Mother   . Lung cancer Father   . Irritable bowel syndrome Sister   . Cystic fibrosis Other   . Irritable bowel syndrome Sister   . Esophageal cancer Neg Hx   . Stomach cancer Neg Hx   . Rectal cancer Neg Hx   . Colon cancer Neg Hx     Social History   Socioeconomic History  . Marital status: Divorced    Spouse name: Not on file  . Number of children: 0  . Years of education: Not on file  . Highest education level: Not on file  Occupational History  . Occupation: Admin Assist  Tobacco Use  . Smoking status: Never Smoker  . Smokeless tobacco: Never Used  Vaping Use  . Vaping Use: Never used  Substance and Sexual Activity  . Alcohol use: Not Currently  . Drug use: No  . Sexual activity: Yes    Partners: Male    Birth control/protection: Post-menopausal  Other Topics Concern  . Not on file  Social History Narrative  . Not on file   Social Determinants of Health   Financial  Resource Strain: Not on file  Food Insecurity: Not on file  Transportation Needs: Not on file  Physical Activity: Not on file  Stress: Not on file  Social  Connections: Not on file  Intimate Partner Violence: Not on file    Review of Systems  All other systems reviewed and are negative.   PHYSICAL EXAMINATION:    BP 122/64   Pulse 63   Ht 5\' 4"  (1.626 m)   Wt 143 lb (64.9 kg)   SpO2 99%   BMI 24.55 kg/m     General appearance: alert, cooperative and appears stated age   Pelvic: External genitalia:  Atrophy, minimal whitening, mild loss of agglutination. She is tender with palpation around the vestibule. She has a tear at 7 o'clock in the posterior fourchette, going in toward the vagina.               Urethra:  normal appearing urethra with no masses, tenderness or lesions              Bartholins and Skenes: normal                 Vagina: atrophic appearing vagina with normal color and discharge, no lesions              Cervix: absent                Chaperone was present for exam.  1. Dyspareunia, female Atrophic even with 2 x a week vaginal estrogen. Laceration noted at vaginal opening. Will increase to 3 x a week for the next 3 months.  Discussed using lots of lubrication and her controlling rate and depth of penetration.  - Estradiol (YUVAFEM) 10 MCG TABS vaginal tablet; For the next 3 months increase to using one tablet vaginally 3 x a week at hs  Dispense: 36 tablet; Refill: 0 - lidocaine (XYLOCAINE) 5 % ointment; Apply a pea sized amount topically 20 minutes prior to intercourse, wipe off just prior to intercourse.  Dispense: 30 g; Refill: 0  2. Nocturia Don't drink liquids within 3 hours of bedtime - Urinalysis,Complete w/RFL Culture  3. Vaginal atrophy  - Estradiol (YUVAFEM) 10 MCG TABS vaginal tablet; For the next 3 months increase to using one tablet vaginally 3 x a week at hs  Dispense: 36 tablet; Refill: 0  4. Lichen sclerosus Stable, using steroid ointment  2 x a week.

## 2021-01-24 ENCOUNTER — Other Ambulatory Visit: Payer: Self-pay | Admitting: *Deleted

## 2021-01-24 LAB — URINE CULTURE
MICRO NUMBER:: 11736435
SPECIMEN QUALITY:: ADEQUATE

## 2021-01-24 LAB — URINALYSIS, COMPLETE W/RFL CULTURE
Bacteria, UA: NONE SEEN /HPF
Bilirubin Urine: NEGATIVE
Glucose, UA: NEGATIVE
Hgb urine dipstick: NEGATIVE
Hyaline Cast: NONE SEEN /LPF
Ketones, ur: NEGATIVE
Nitrites, Initial: NEGATIVE
Protein, ur: NEGATIVE
RBC / HPF: NONE SEEN /HPF (ref 0–2)
Specific Gravity, Urine: 1.008 (ref 1.001–1.03)
Squamous Epithelial / HPF: NONE SEEN /HPF (ref <=5)
pH: 5.5 (ref 5.0–8.0)

## 2021-01-24 LAB — CULTURE INDICATED

## 2021-01-24 MED ORDER — SULFAMETHOXAZOLE-TRIMETHOPRIM 800-160 MG PO TABS: 1.0000 | ORAL_TABLET | Freq: Two times a day (BID) | ORAL | 0 refills | Status: DC

## 2021-01-29 ENCOUNTER — Other Ambulatory Visit: Payer: Self-pay | Admitting: Obstetrics and Gynecology

## 2021-01-29 DIAGNOSIS — N952 Postmenopausal atrophic vaginitis: Secondary | ICD-10-CM

## 2021-01-29 DIAGNOSIS — N941 Unspecified dyspareunia: Secondary | ICD-10-CM

## 2021-01-29 DIAGNOSIS — L900 Lichen sclerosus et atrophicus: Secondary | ICD-10-CM

## 2021-05-22 ENCOUNTER — Other Ambulatory Visit: Payer: Self-pay | Admitting: Obstetrics and Gynecology

## 2021-05-22 DIAGNOSIS — N952 Postmenopausal atrophic vaginitis: Secondary | ICD-10-CM

## 2021-05-22 DIAGNOSIS — N941 Unspecified dyspareunia: Secondary | ICD-10-CM

## 2021-05-26 NOTE — Telephone Encounter (Signed)
Call to [facility], spoke with Abigail Butts. Per Abigail Butts, patient's last mammogram was 06-23-2019.

## 2021-05-26 NOTE — Telephone Encounter (Signed)
Please see the details from my note from earlier today. Please call the patient and let her know she is overdue for her mammogram and ask her to schedule it.  Please check with the patient if she feels she is benefiting from the 3 x a week Yuvafem, if so continue it at 3 x a week, if not go back to 2 x a week.  Please call in a 3 month supply only, need her mammogram prior to further refills.

## 2021-05-26 NOTE — Telephone Encounter (Signed)
Medication refill request: April Houston  Last AEX:  12-04-20 JJ Next AEX: not currently scheduled  Last MMG (if hormonal medication request): Mammogram last summer (will get report),06/15/19 incomplete, 06/23/19 f/u imaging benign  Refill authorized: Medication pended for #36, 0RF. Please refill if appropriate.

## 2021-05-26 NOTE — Telephone Encounter (Signed)
The patient told me at her annual exam in 2/22 that she had a mammogram in 2021 at [facility]. Please call over and get the report.  As long as her mammogram is normal, then please send in a refill for her yuvafem to get her through to her next exam which should be in 2/23.  Please check with the patient if she feels she is benefiting from the 3 x a week Yuvafem, if so continue it at 3 x a week, if not go back to 2 x a week.

## 2021-05-27 NOTE — Telephone Encounter (Signed)
Spoke with patient and she wold like to continue using 3 times weekly. States she will call today and schedule mammogram.

## 2021-06-03 ENCOUNTER — Telehealth: Payer: Self-pay | Admitting: Internal Medicine

## 2021-06-03 NOTE — Telephone Encounter (Signed)
Pt stating that she has problems with constipation. She states that she is taking Miralax, prunes, and bran muffins. Pt stated that she is having bloody stools. Pt stated that it was 3/4 cup of blood. Pt currently has an appt for next Tuesday. Pt was encouraged to go to see her PCP, Urgent Care, or the Emergency Room. Pt states that she does not want to waste her time or Money and she will wait for the Tuesday appt unless she has another large bloody BM. Pt was complaining of anal pain and was recommended [facility].

## 2021-06-03 NOTE — Telephone Encounter (Signed)
Pt called again she is requesting a call back asap. She stated to be in a lot of pain, in a scale of 1 to 10, pt stated that pain is above 10. Pt does not want to go to the ED because this has happened to her before and she did not receive much help. Pls call her.

## 2021-06-03 NOTE — Telephone Encounter (Signed)
Inbound call from patient. States she is in extreme pain and do not know what to do. States she had an hard stool and her anal fissure has burst and is bleeding a lot. Would like a call back as soon as can [phone]

## 2021-06-10 ENCOUNTER — Ambulatory Visit: Payer: 59 | Admitting: Physician Assistant

## 2021-06-10 ENCOUNTER — Encounter: Payer: Self-pay | Admitting: Physician Assistant

## 2021-06-10 VITALS — BP 100/60 | HR 64 | Ht 64.0 in | Wt 141.0 lb

## 2021-06-10 DIAGNOSIS — K5909 Other constipation: Secondary | ICD-10-CM | POA: Diagnosis not present

## 2021-06-10 DIAGNOSIS — K602 Anal fissure, unspecified: Secondary | ICD-10-CM

## 2021-06-10 DIAGNOSIS — K219 Gastro-esophageal reflux disease without esophagitis: Secondary | ICD-10-CM | POA: Diagnosis not present

## 2021-06-10 MED ORDER — DILTIAZEM GEL 2 %: 1.0000 "application " | Freq: Three times a day (TID) | CUTANEOUS | 1 refills | Status: DC

## 2021-06-10 NOTE — Progress Notes (Signed)
Noted  

## 2021-06-10 NOTE — Patient Instructions (Addendum)
Increase Miralax to twice daily.  Increase Colace to 2 tablets daily.   We have sent a prescription for Diltiazem 2% gel to [facility] for you. Using your index finger, you should apply a small amount of medication inside the rectum up to your first knuckle/joint three times daily x 6-8 weeks.  [facility]'s information is below: Address: [address] [place], [place] [address]  [phone]  *Please DO NOT go directly from our office to pick up this medication! Give the pharmacy 1 day to process the prescription as this is compounded and takes time to make.

## 2021-06-10 NOTE — Progress Notes (Signed)
Chief Complaint: Rectal bleeding and chronic constipation  HPI:    April Houston is a 75 year old female with a past medical history as listed below including reflux and IBS, known to Dr. Henrene Pastor, who presents clinic today with a complaint of rectal bleeding and chronic constipation.    2015 colonoscopy with hyperplastic polyp.    08/20/2020 patient seen in clinic by Dr. Henrene Pastor for a "raspy voice" and episode of rectal bleeding.  Was noted she had chronic reflux and was status post fundoplication with redo fundoplication.  Also noted she had chronic dysphagia and a prior endoscopy revealed a small esophageal diverticulum.  Also had a history of gastroparesis.  At that time was continued on twice daily PPI.  Advised to have her primary care provider refer her to ENT for her raspy voice.  At that time her rectal bleeding was thought secondary to a prolapsed hemorrhoid which had resolved.    06/03/2021 patient called the clinic and described extreme pain after passing a hard stool with some rectal bleeding.  [facility] was recommended.  She was told to go to the ER, PCP urgent care but she wanted to wait until her appointment to be seen.  Described three-quarter cup of blood from her rectum.    Today, the patient presents to clinic and tells me that she has been constipated since she was 70.  She eats a bran muffin in the morning and prunes at night as well as takes 1 dose of MiraLAX throughout the day.  She has never taken more than this because she is worried that she will have trouble "while at work".  Tells me it is very hard for her to pass a stool and she often strains to have big stools which result in "fissures".  Tells me she is never treated her fissures with anything other than lidocaine which was prescribed by her OB/GYN.  Patient's sister tried Linzess and told her never to try it because it was so strong.  Does get a lot of pain when passing a bowel movement which lasts afterwards and has rectal  bleeding.    Continues with heartburn.  Tells me she has to take her omeprazole 40 mg in the morning and then will go for a walk and come back and take Pepcid 20 mg.    Denies fever, chills, weight loss or symptoms that awaken her from sleep.      Past Medical History:  Diagnosis Date   Anal fissure    Anal pain    Anemia    Anxiety    Blood transfusion without reported diagnosis 2002   After Nissen Fundiplication   Bradycardia    Cataract    Chronic headaches    Depression    Endometriosis    GERD (gastroesophageal reflux disease)    Hiatal hernia    Hypertension    no meds/ in past   IBS (irritable bowel syndrome)    Lichen sclerosus    Pancreatitis    Panic attacks    Peptic ulcer    PVC (premature ventricular contraction)    SOB (shortness of breath)    with panic attack and anxiety   Wears glasses     Past Surgical History:  Procedure Laterality Date   CATARACT EXTRACTION, BILATERAL     Bil/ In 2018   LAPAROSCOPIC NISSEN FUNDOPLICATION  [id number]   and Repair Hiatal Hernia   LIPOMA EXCISION     RECTAL EXAM UNDER ANESTHESIA N/A 12/06/2015   Procedure:  ANAL  EXAM UNDER ANESTHESIA, BIOPSY;  Surgeon: Leighton Ruff, MD;  Location: [facility];  Service: General;  Laterality: N/A;   TONSILLECTOMY  age 60   TOTAL ABDOMINAL HYSTERECTOMY W/ BILATERAL SALPINGOOPHORECTOMY  1992    Current Outpatient Medications  Medication Sig Dispense Refill   ALPRAZolam (XANAX) 1 MG tablet Take 1 tab 3 times a day     augmented betamethasone dipropionate (DIPROLENE-AF) 0.05 % ointment Apply topically 2 (two) times daily.     betamethasone valerate ointment (VALISONE) 0.1 % Use a pea sized amount topically 2 x a week. Can increase to 2 x a day for up to a week as needed. 30 g 0   calcium carbonate (TUMS - DOSED IN MG ELEMENTAL CALCIUM) 500 MG chewable tablet Chew 1 tablet by mouth daily.     docusate sodium (COLACE) 100 MG capsule Take 100 mg by mouth daily.     Estradiol  (YUVAFEM) 10 MCG TABS vaginal tablet Place 1 tablet (10 mcg total) vaginally 3 (three) times a week. 36 tablet 0   FLUoxetine (PROZAC) 10 MG capsule Take 1 cap twice a day     hydrOXYzine (ATARAX/VISTARIL) 25 MG tablet Take 1 tab tid for anxiety (dose changed)     lidocaine (XYLOCAINE) 5 % ointment Apply a pea sized amount topically 20 minutes prior to intercourse, wipe off just prior to intercourse. 30 g 0   Multiple Vitamins-Minerals (PRESERVISION AREDS 2) CAPS Take 1 capsule by mouth 2 (two) times daily.     omeprazole (PRILOSEC) 40 MG capsule Take 1 capsule (40 mg total) by mouth 2 (two) times daily. 180 capsule 3   OVER THE COUNTER MEDICATION OTC EMUAID MAX- apply to vaginal and rectal area PRN     polyethylene glycol powder (GLYCOLAX/MIRALAX) powder Take 17 g by mouth 2 (two) times daily. 255 g 0   psyllium (METAMUCIL) 58.6 % powder Take 1 packet by mouth daily.     sulfamethoxazole-trimethoprim (BACTRIM DS) 800-160 MG tablet Take 1 tablet by mouth 2 (two) times daily. 6 tablet 0   valACYclovir (VALTREX) 500 MG tablet Take 500 mg by mouth 2 (two) times daily.     No current facility-administered medications for this visit.    Allergies as of 06/10/2021 - Review Complete 01/20/2021  Allergen Reaction Noted   Hydrocodone Itching 12/03/2015   Other Other (See Comments) 12/03/2015   Zantac [ranitidine hcl] Other (See Comments) 04/17/2015    Family History  Problem Relation Age of Onset   Hypertension Mother    Hyperlipidemia Mother    Stroke Mother    Diabetes Mother    Irritable bowel syndrome Mother    Kidney cancer Mother    Lung cancer Father    Irritable bowel syndrome Sister    Cystic fibrosis Other    Irritable bowel syndrome Sister    Esophageal cancer Neg Hx    Stomach cancer Neg Hx    Rectal cancer Neg Hx    Colon cancer Neg Hx     Social History   Socioeconomic History   Marital status: Divorced    Spouse name: Not on file   Number of children: 0   Years of  education: Not on file   Highest education level: Not on file  Occupational History   Occupation: Admin Assist  Tobacco Use   Smoking status: Never   Smokeless tobacco: Never  Vaping Use   Vaping Use: Never used  Substance and Sexual Activity   Alcohol use: Not  Currently   Drug use: No   Sexual activity: Yes    Partners: Male    Birth control/protection: Post-menopausal  Other Topics Concern   Not on file  Social History Narrative   Not on file   Social Determinants of Health   Financial Resource Strain: Not on file  Food Insecurity: Not on file  Transportation Needs: Not on file  Physical Activity: Not on file  Stress: Not on file  Social Connections: Not on file  Intimate Partner Violence: Not on file    Review of Systems:    Constitutional: No weight loss, fever or chills Cardiovascular: No chest pain  Respiratory: No SOB  Gastrointestinal: See HPI and otherwise negative   Physical Exam:  Vital signs: BP 100/60   Pulse 64   Ht '5\' 4"'$  (1.626 m)   Wt 141 lb (64 kg)   BMI 24.20 kg/m    Constitutional:   Pleasant Caucasian female appears to be in NAD, Well developed, Well nourished, alert and cooperative Respiratory: Respirations even and unlabored. Lungs clear to auscultation bilaterally.   No wheezes, crackles, or rhonchi.  Cardiovascular: Normal S1, S2. No MRG. Regular rate and rhythm. No peripheral edema, cyanosis or pallor.  Gastrointestinal:  Soft, nondistended, nontender. No rebound or guarding. Normal bowel sounds. No appreciable masses or hepatomegaly. Rectal:  +lichen planus, External: +posterior fissure with ttp; Internal: deferred due to discomfort Psychiatric: Demonstrates good judgement and reason without abnormal affect or behaviors.  RELEVANT LABS AND IMAGING: CBC    Component Value Date/Time   WBC 7.1 12/24/2020 1042   RBC 4.09 12/24/2020 1042   HGB 12.6 12/24/2020 1042   HCT 36.7 12/24/2020 1042   PLT 277 12/24/2020 1042   MCV 89.7  12/24/2020 1042   MCH 30.8 12/24/2020 1042   MCHC 34.3 12/24/2020 1042   RDW 13.0 12/24/2020 1042   LYMPHSABS 2.5 12/24/2020 1042   MONOABS 0.6 12/24/2020 1042   EOSABS 0.2 12/24/2020 1042   BASOSABS 0.1 12/24/2020 1042    CMP     Component Value Date/Time   NA 134 (L) 12/24/2020 1042   K 4.0 12/24/2020 1042   CL 106 12/24/2020 1042   CO2 20 (L) 12/24/2020 1042   GLUCOSE 96 12/24/2020 1042   BUN 18 12/24/2020 1042   CREATININE 1.33 (H) 12/24/2020 1042   CALCIUM 9.1 12/24/2020 1042   PROT 6.3 (L) 12/24/2020 1042   ALBUMIN 4.0 12/24/2020 1042   AST 17 12/24/2020 1042   ALT 19 12/24/2020 1042   ALKPHOS 56 12/24/2020 1042   BILITOT 0.8 12/24/2020 1042   GFRNONAA 43 (L) 12/24/2020 1042   GFRAA 62 (L) 12/28/2011 2132    Assessment: 1.  Anal fissure: Posterior at time of exam today 2.  Chronic constipation: Since she was 75 years old, likely IBS-C 3.  Heartburn: Status post fundoplication with redo of fundoplication, continues with heartburn symptoms now, some better with Omeprazole 40 mg twice daily in addition to Pepcid 20 mg  Plan: 1.  Discussed with patient that it is safe for her to take Omeprazole 40 mg as well as Pepcid 20 mg in the morning.  As long as this is working for her would continue. 2.  Discussed chronic constipation.  Would recommend patient titrate up on MiraLAX first.  Recommend she take MiraLAX twice daily in addition to 2 Colace tabs per day to see if this helps.  Discussed titration of this up to 4 times a day if necessary.  (Briefly discussed the use  of Linzess or Amitiza in the future) 3.  Prescribed Diltiazem ointment 3 times daily x6-8 weeks. 4.  Patient tells me she has Lidocaine at home which she can use as needed. 5.  Patient to follow in clinic with me in 3 months or sooner if necessary.  Ellouise Newer, PA-C [facility] 06/10/2021, 9:29 AM  Cc: Arlyss Repress, MD

## 2021-06-27 ENCOUNTER — Encounter: Payer: Self-pay | Admitting: Obstetrics and Gynecology

## 2021-07-11 ENCOUNTER — Encounter: Payer: Self-pay | Admitting: Obstetrics and Gynecology

## 2021-07-29 ENCOUNTER — Other Ambulatory Visit: Payer: Self-pay | Admitting: Radiology

## 2021-07-31 ENCOUNTER — Telehealth: Payer: Self-pay | Admitting: *Deleted

## 2021-07-31 ENCOUNTER — Encounter: Payer: Self-pay | Admitting: *Deleted

## 2021-07-31 DIAGNOSIS — C50411 Malignant neoplasm of upper-outer quadrant of right female breast: Secondary | ICD-10-CM

## 2021-07-31 NOTE — Telephone Encounter (Signed)
Spoke with patient to confirm [facility] appt for 10/19 at 12:15pm. Contact information given and packet was emailed per patient request.

## 2021-08-04 ENCOUNTER — Encounter: Payer: Self-pay | Admitting: Obstetrics and Gynecology

## 2021-08-05 NOTE — Progress Notes (Signed)
[facility] CONSULT NOTE  Patient Care Team: Arlyss Repress, MD as PCP - General (Internal Medicine) Rockwell Germany, RN as Oncology Nurse Navigator Mauro Kaufmann, RN as Oncology Nurse Navigator Jovita Kussmaul, MD as Consulting Physician (General Surgery) Nicholas Lose, MD as Consulting Physician (Hematology and Oncology) Eppie Gibson, MD as Attending Physician (Radiation Oncology)  CHIEF COMPLAINTS/PURPOSE OF CONSULTATION:  Newly diagnosed breast cancer  HISTORY OF PRESENTING ILLNESS:  April Houston 75 y.o. female is here because of recent diagnosis of invasive ductal carcinoma of the right breast. Screening mammogram on 06/26/2021 showed possible asymmetry in the right breast. Diagnostic mammogram and [place] on 07/10/2021 showed 1.1 cm irregular mass with spiculated margins and calcifications at 12:00 position. Biopsy on 07/29/2021 showed invasive ductal carcinoma and DCIS, ER+(100%)/PR-. She presents to the clinic today for initial evaluation and discussion of treatment options.   I reviewed her records extensively and collaborated the history with the patient.  SUMMARY OF ONCOLOGIC HISTORY: Oncology History  Malignant neoplasm of upper-outer quadrant of right breast in female, estrogen receptor positive ([place])  07/29/2021 Initial Diagnosis   Screening mammogram: possible asymmetry in the right breast. Diagnostic mammogram and [place]: 1.1 cm irregular mass with spiculated margins and calcifications at 12:00 position. Biopsy: Grade 2 IDC and DCIS, ER+(100%)/PR-HER2 negative, Ki-67 10%     MEDICAL HISTORY:  Past Medical History:  Diagnosis Date   Anal fissure    Anal pain    Anemia    Anxiety    Blood transfusion without reported diagnosis 2002   After Nissen Fundiplication   Bradycardia    Breast cancer ([place])    Cataract    Chronic headaches    Depression    Endometriosis    GERD (gastroesophageal reflux disease)    Hiatal hernia    Hypertension    no meds/  in past   IBS (irritable bowel syndrome)    Lichen sclerosus    Pancreatitis    Panic attacks    Peptic ulcer    PVC (premature ventricular contraction)    SOB (shortness of breath)    with panic attack and anxiety   Wears glasses     SURGICAL HISTORY: Past Surgical History:  Procedure Laterality Date   CATARACT EXTRACTION, BILATERAL     Bil/ In 2018   LAPAROSCOPIC NISSEN FUNDOPLICATION  9373   and Repair Hiatal Hernia   LIPOMA EXCISION     RECTAL EXAM UNDER ANESTHESIA N/A 12/06/2015   Procedure: ANAL  EXAM UNDER ANESTHESIA, BIOPSY;  Surgeon: Leighton Ruff, MD;  Location: [facility];  Service: General;  Laterality: N/A;   TONSILLECTOMY  age 72   TOTAL ABDOMINAL HYSTERECTOMY W/ BILATERAL SALPINGOOPHORECTOMY  1992    SOCIAL HISTORY: Social History   Socioeconomic History   Marital status: Divorced    Spouse name: Not on file   Number of children: 0   Years of education: Not on file   Highest education level: Not on file  Occupational History   Occupation: Admin Assist  Tobacco Use   Smoking status: Never   Smokeless tobacco: Never  Vaping Use   Vaping Use: Never used  Substance and Sexual Activity   Alcohol use: Not Currently   Drug use: No   Sexual activity: Yes    Partners: Male    Birth control/protection: Post-menopausal  Other Topics Concern   Not on file  Social History Narrative   Not on file   Social Determinants of Health  Financial Resource Strain: Not on file  Food Insecurity: Not on file  Transportation Needs: Not on file  Physical Activity: Not on file  Stress: Not on file  Social Connections: Not on file  Intimate Partner Violence: Not on file    FAMILY HISTORY: Family History  Problem Relation Age of Onset   Hypertension Mother    Hyperlipidemia Mother    Stroke Mother    Diabetes Mother    Irritable bowel syndrome Mother    Kidney cancer Mother    Lung cancer Father    Irritable bowel syndrome Sister    Cystic  fibrosis Other    Irritable bowel syndrome Sister    Esophageal cancer Neg Hx    Stomach cancer Neg Hx    Rectal cancer Neg Hx    Colon cancer Neg Hx     ALLERGIES:  is allergic to hydrocodone, other, and zantac [ranitidine hcl].  MEDICATIONS:  Current Outpatient Medications  Medication Sig Dispense Refill   ALPRAZolam (XANAX) 1 MG tablet Take 1 tab 3 times a day     Estradiol (YUVAFEM) 10 MCG TABS vaginal tablet Place 1 tablet (10 mcg total) vaginally 3 (three) times a week. 36 tablet 0   eszopiclone (LUNESTA) 2 MG TABS tablet Take 2 mg by mouth at bedtime.     FLUoxetine (PROZAC) 10 MG capsule Take 1 cap twice a day     hydrOXYzine (ATARAX/VISTARIL) 25 MG tablet Take 1 tab tid for anxiety (dose changed)     omeprazole (PRILOSEC) 40 MG capsule Take 1 capsule (40 mg total) by mouth 2 (two) times daily. 180 capsule 3   diltiazem 2 % GEL Apply 1 application topically 3 (three) times daily. 30 g 1   polyethylene glycol powder (GLYCOLAX/MIRALAX) powder Take 17 g by mouth 2 (two) times daily. 255 g 0   topiramate (TOPAMAX) 25 MG tablet Take 1 tab q hs x 7d, then 2 q hs     Vitamin D, Ergocalciferol, (DRISDOL) 1.25 MG (50000 UNIT) CAPS capsule Take 50,000 Units by mouth once a week.     No current facility-administered medications for this visit.    REVIEW OF SYSTEMS:   Constitutional: Denies fevers, chills or abnormal night sweats Eyes: Denies blurriness of vision, double vision or watery eyes Ears, nose, mouth, throat, and face: Denies mucositis or sore throat Respiratory: Denies cough, dyspnea or wheezes Cardiovascular: Denies palpitation, chest discomfort or lower extremity swelling Gastrointestinal:  Denies nausea, heartburn or change in bowel habits Skin: Denies abnormal skin rashes Lymphatics: Denies new lymphadenopathy or easy bruising Neurological:Denies numbness, tingling or new weaknesses Behavioral/Psych: Mood is stable, no new changes  Breast:  Denies any palpable lumps  or discharge All other systems were reviewed with the patient and are negative.  PHYSICAL EXAMINATION: ECOG PERFORMANCE STATUS: 1 - Symptomatic but completely ambulatory  Vitals:   08/06/21 1242  BP: 132/67  Pulse: 84  Resp: 18  Temp: 97.8 F (36.6 C)  SpO2: 98%   Filed Weights   08/06/21 1242  Weight: 143 lb 14.4 oz (65.3 kg)    GENERAL:alert, no distress and comfortable SKIN: skin color, texture, turgor are normal, no rashes or significant lesions EYES: normal, conjunctiva are pink and non-injected, sclera clear OROPHARYNX:no exudate, no erythema and lips, buccal mucosa, and tongue normal  NECK: supple, thyroid normal size, non-tender, without nodularity LYMPH:  no palpable lymphadenopathy in the cervical, axillary or inguinal LUNGS: clear to auscultation and percussion with normal breathing effort HEART: regular rate &  rhythm and no murmurs and no lower extremity edema ABDOMEN:abdomen soft, non-tender and normal bowel sounds Musculoskeletal:no cyanosis of digits and no clubbing  PSYCH: alert & oriented x 3 with fluent speech NEURO: no focal motor/sensory deficits BREAST: No palpable nodules in breast. No palpable axillary or supraclavicular lymphadenopathy (exam performed in the presence of a chaperone)   LABORATORY DATA:  I have reviewed the data as listed Lab Results  Component Value Date   WBC 8.6 08/06/2021   HGB 12.5 08/06/2021   HCT 37.6 08/06/2021   MCV 90.6 08/06/2021   PLT 281 08/06/2021   Lab Results  Component Value Date   NA 142 08/06/2021   K 3.8 08/06/2021   CL 108 08/06/2021   CO2 24 08/06/2021    RADIOGRAPHIC STUDIES: I have personally reviewed the radiological reports and agreed with the findings in the report.  ASSESSMENT AND PLAN:  Malignant neoplasm of upper-outer quadrant of right breast in female, estrogen receptor positive ([place]) 07/29/2021:Screening mammogram: possible asymmetry in the right breast. Diagnostic mammogram and [place]: 1.1 cm  irregular mass with spiculated margins and calcifications at 12:00 position. Biopsy: Grade 2 IDC and DCIS, ER+(100%)/PR-HER2 negative, Ki-67 10% Additional 0.5 cm lesion: Benign, axilla negative  Pathology and radiology counseling:Discussed with the patient, the details of pathology including the type of breast cancer,the clinical staging, the significance of ER, PR and HER-2/neu receptors and the implications for treatment. After reviewing the pathology in detail, we proceeded to discuss the different treatment options between surgery, radiation, chemotherapy, antiestrogen therapies.  Recommendations: 1. Breast conserving surgery followed by 2. patient does not want chemotherapy and therefore there is no point in doing Oncotype DX testing. 3.  Patient is also not interested in radiation 4. Adjuvant antiestrogen therapy with letrozole 2.5 mg daily x5 years  Letrozole counseling: We discussed the risks and benefits of anti-estrogen therapy with aromatase inhibitors. These include but not limited to insomnia, hot flashes, mood changes, vaginal dryness, bone density loss, and weight gain. We strongly believe that the benefits far outweigh the risks. Patient understands these risks and consented to starting treatment. Planned treatment duration is 5 years.  Return to clinic after surgery to discuss final pathology report and then determine if Oncotype DX testing will need to be sent.     All questions were answered. The patient knows to call the clinic with any problems, questions or concerns.   Rulon Eisenmenger, MD, MPH 08/06/2021    I, Thana Ates, am acting as scribe for Nicholas Lose, MD.  I have reviewed the above documentation for accuracy and completeness, and I agree with the above.

## 2021-08-05 NOTE — Progress Notes (Signed)
Radiation Oncology         [phone] ________________________________  Initial Outpatient Consultation  Name: April Houston MRN: [id number]  Date: 08/06/2021  DOB: 02/15/1946  CC:Arlyss Repress, MD  Jovita Kussmaul, MD   REFERRING PHYSICIAN: [name], MD  DIAGNOSIS:    ICD-10-CM   1. Malignant neoplasm of upper-outer quadrant of right breast in female, estrogen receptor positive ([place])  C50.411    Z17.0       Cancer Staging Malignant neoplasm of upper-outer quadrant of right breast in female, estrogen receptor positive ([facility]) Staging form: Breast, AJCC 8th Edition - Clinical stage from 08/06/2021: Stage IA (cT1b, cN0, cM0, G2, ER+, PR-, HER2-) - Signed by Nicholas Lose, MD on 08/06/2021 Stage prefix: Initial diagnosis Histologic grading system: 3 grade system - Pathologic: No stage assigned - Unsigned   CHIEF COMPLAINT: Here to discuss management of right breast cancer  HISTORY OF PRESENT ILLNESS::April Houston is a 75 y.o. female who presented with right breast abnormality on the following imaging: bilateral screening mammogram on the date of 06/26/21 and 06/27/21. Diagnostic right breast mammogram on 07/10/21 further revealed an indeterminate 1.1 cm irregular equal density mass in the right breast at the 12 o'clock position -posterior depth. An additional indeterminate irregular mass measuring 0.7 cm x 0.4 cm in the right breast 8 o'clock retroareolar position was seen as well.  No symptoms, if any, were reported at that time.  Ultrasound of breast on 07/10/21 further revealed the right breast mass at the 12 o'clock posterior depth as highly suggestive of malignancy. The right breast mass at the 8 o'clock position was also seen as suspicious of malignancy and measuring 0.5 x 0.6 x 0.2 cm.    Right breast biopsy at the 12 o'clock position 5 cmfn on date of 07/29/21 showed invasive and in-situ ductal carcinoma measuring 0.5 cm in the greatest linear extent. (No malignancy  identified from right breast biopsy at the 8 o'clock position). ER status: 100% positive with strong staining intensity; PR status negative, Her2 status negative; Ki67 10%; Grade 2.  She is heavily leaning towards undergoing mastectomy.  She does not want to undergo radiation therapy and she yet wants to be aggressive in her care with the minimal chance of relapse moving forward.  She is comfortable with receiving antiestrogen therapy.Marland Kitchen  PREVIOUS RADIATION THERAPY: No  PAST MEDICAL HISTORY:  has a past medical history of Anal fissure, Anal pain, Anemia, Anxiety, Blood transfusion without reported diagnosis (2002), Bradycardia, Breast cancer ([place]), Cataract, Chronic headaches, Depression, Endometriosis, GERD (gastroesophageal reflux disease), Hiatal hernia, Hypertension, IBS (irritable bowel syndrome), Lichen sclerosus, Pancreatitis, Panic attacks, Peptic ulcer, PVC (premature ventricular contraction), SOB (shortness of breath), and Wears glasses.    PAST SURGICAL HISTORY: Past Surgical History:  Procedure Laterality Date   CATARACT EXTRACTION, BILATERAL     Bil/ In 2018   LAPAROSCOPIC NISSEN FUNDOPLICATION  0511   and Repair Hiatal Hernia   LIPOMA EXCISION     RECTAL EXAM UNDER ANESTHESIA N/A 12/06/2015   Procedure: ANAL  EXAM UNDER ANESTHESIA, BIOPSY;  Surgeon: Leighton Ruff, MD;  Location: [facility];  Service: General;  Laterality: N/A;   TONSILLECTOMY  age 40   TOTAL ABDOMINAL HYSTERECTOMY W/ BILATERAL SALPINGOOPHORECTOMY  1992    FAMILY HISTORY: family history includes Cystic fibrosis in an other family member; Diabetes in her mother; Hyperlipidemia in her mother; Hypertension in her mother; Irritable bowel syndrome in her mother, sister, and sister; Kidney cancer in her mother;  Lung cancer in her father; Stroke in her mother.  SOCIAL HISTORY:  reports that she has never smoked. She has never used smokeless tobacco. She reports that she does not currently use alcohol. She  reports that she does not use drugs.  ALLERGIES: Hydrocodone, Other, and Zantac [ranitidine hcl]  MEDICATIONS:  Current Outpatient Medications  Medication Sig Dispense Refill   ALPRAZolam (XANAX) 1 MG tablet Take 1 tab 3 times a day     diltiazem 2 % GEL Apply 1 application topically 3 (three) times daily. 30 g 1   Estradiol (YUVAFEM) 10 MCG TABS vaginal tablet Place 1 tablet (10 mcg total) vaginally 3 (three) times a week. 36 tablet 0   eszopiclone (LUNESTA) 2 MG TABS tablet Take 2 mg by mouth at bedtime.     FLUoxetine (PROZAC) 10 MG capsule Take 1 cap twice a day     hydrOXYzine (ATARAX/VISTARIL) 25 MG tablet Take 1 tab tid for anxiety (dose changed)     omeprazole (PRILOSEC) 40 MG capsule Take 1 capsule (40 mg total) by mouth 2 (two) times daily. 180 capsule 3   polyethylene glycol powder (GLYCOLAX/MIRALAX) powder Take 17 g by mouth 2 (two) times daily. 255 g 0   topiramate (TOPAMAX) 25 MG tablet Take 1 tab q hs x 7d, then 2 q hs     Vitamin D, Ergocalciferol, (DRISDOL) 1.25 MG (50000 UNIT) CAPS capsule Take 50,000 Units by mouth once a week.     No current facility-administered medications for this encounter.    REVIEW OF SYSTEMS: As above in HPI.   PHYSICAL EXAM:  vitals were not taken for this visit.   General: Alert and oriented, in no acute distress Breast exam was deferred today.   ECOG = 1  0 - Asymptomatic (Fully active, able to carry on all predisease activities without restriction)  1 - Symptomatic but completely ambulatory (Restricted in physically strenuous activity but ambulatory and able to carry out work of a light or sedentary nature. For example, light housework, office work)  2 - Symptomatic, <50% in bed during the day (Ambulatory and capable of all self care but unable to carry out any work activities. Up and about more than 50% of waking hours)  3 - Symptomatic, >50% in bed, but not bedbound (Capable of only limited self-care, confined to bed or chair 50%  or more of waking hours)  4 - Bedbound (Completely disabled. Cannot carry on any self-care. Totally confined to bed or chair)  5 - Death   Eustace Pen MM, Creech RH, Tormey DC, et al. [phone]). "Toxicity and response criteria of the [facility]". [place] Oncol. 5 (6): 649-55   LABORATORY DATA:  Lab Results  Component Value Date   WBC 8.6 08/06/2021   HGB 12.5 08/06/2021   HCT 37.6 08/06/2021   MCV 90.6 08/06/2021   PLT 281 08/06/2021   CMP     Component Value Date/Time   NA 142 08/06/2021 1211   K 3.8 08/06/2021 1211   CL 108 08/06/2021 1211   CO2 24 08/06/2021 1211   GLUCOSE 76 08/06/2021 1211   BUN 16 08/06/2021 1211   CREATININE 1.32 (H) 08/06/2021 1211   CALCIUM 9.6 08/06/2021 1211   PROT 6.8 08/06/2021 1211   ALBUMIN 4.1 08/06/2021 1211   AST 14 (L) 08/06/2021 1211   ALT 14 08/06/2021 1211   ALKPHOS 56 08/06/2021 1211   BILITOT 0.4 08/06/2021 1211   GFRNONAA 43 (L) 08/06/2021 1211  GFRAA 62 (L) 12/28/2011 2132        RADIOGRAPHY: As above    IMPRESSION/PLAN: This is a very pleasant 75 year old woman with stage I estrogen sensitive right breast cancer  She has been discussed at our multidisciplinary tumor board.  The consensus is that she would be a good candidate for breast conservation. I talked to her about the option of a mastectomy and informed her that her expected overall survival would be equivalent between mastectomy and breast conservation, based upon randomized controlled data.    We discussed that her risk of local relapse over the next decade with lumpectomy and antiestrogen therapy would be no more than 10%.  We discussed that this risk would be reduced by about 7 absolute percent if she adds radiation therapy to her treatment protocol (2-3% risk of local relapse).  She understands that mastectomy is not necessary to have a good chance of cure.  However, she wants to be aggressive in her care and she wishes to have a minimal risk of  local relapse while avoiding radiation therapy.  She understands that her risk of local relapse with mastectomy and antiestrogens would be about 2 to 3%.  This is the treatment paradigm that she is satisfied with pursuing.  We had a thorough discussion about the risks, benefits, and side effects of radiotherapy.  She remains adamant about avoiding radiation therapy.  She now understands that the side effects of radiation therapy to the breast are not the same as the severe side effects that she friends of hers have received when getting treatment other parts of the body, such as the throat and neck.  Given that she is intent on mastectomy, I will see her back on an as-needed basis.  She understands there is a slight risk of needing radiation therapy after mastectomy but she understands that this likelihood is small.  She will be referred back to me if her pathology shows more locally advanced disease than expected.  I wished her the very best.  On date of service, in total, I spent 40 minutes on this encounter. Patient was seen in person.   __________________________________________   Eppie Gibson, MD  This document serves as a record of services personally performed by Eppie Gibson, MD. It was created on her behalf by Roney Mans, a trained medical scribe. The creation of this record is based on the scribe's personal observations and the provider's statements to them. This document has been checked and approved by the attending provider.

## 2021-08-06 ENCOUNTER — Ambulatory Visit: Payer: 59 | Attending: General Surgery | Admitting: Physical Therapy

## 2021-08-06 ENCOUNTER — Inpatient Hospital Stay: Payer: 59

## 2021-08-06 ENCOUNTER — Ambulatory Visit: Payer: Self-pay | Admitting: General Surgery

## 2021-08-06 ENCOUNTER — Other Ambulatory Visit: Payer: Self-pay

## 2021-08-06 ENCOUNTER — Encounter: Payer: Self-pay | Admitting: Physical Therapy

## 2021-08-06 ENCOUNTER — Inpatient Hospital Stay (HOSPITAL_BASED_OUTPATIENT_CLINIC_OR_DEPARTMENT_OTHER): Payer: 59 | Admitting: Hematology and Oncology

## 2021-08-06 ENCOUNTER — Encounter: Payer: Self-pay | Admitting: Hematology and Oncology

## 2021-08-06 ENCOUNTER — Ambulatory Visit
Admission: RE | Admit: 2021-08-06 | Discharge: 2021-08-06 | Disposition: A | Discharge status: Home / Self Care | Payer: 59 | Source: Ambulatory Visit | Attending: Radiation Oncology | Admitting: Radiation Oncology

## 2021-08-06 DIAGNOSIS — Z90722 Acquired absence of ovaries, bilateral: Secondary | ICD-10-CM | POA: Insufficient documentation

## 2021-08-06 DIAGNOSIS — Z8349 Family history of other endocrine, nutritional and metabolic diseases: Secondary | ICD-10-CM | POA: Insufficient documentation

## 2021-08-06 DIAGNOSIS — Z170 Estrogen receptor positive status [ER+]: Secondary | ICD-10-CM | POA: Insufficient documentation

## 2021-08-06 DIAGNOSIS — R293 Abnormal posture: Secondary | ICD-10-CM | POA: Diagnosis present

## 2021-08-06 DIAGNOSIS — Z79899 Other long term (current) drug therapy: Secondary | ICD-10-CM | POA: Insufficient documentation

## 2021-08-06 DIAGNOSIS — Z823 Family history of stroke: Secondary | ICD-10-CM | POA: Insufficient documentation

## 2021-08-06 DIAGNOSIS — Z885 Allergy status to narcotic agent status: Secondary | ICD-10-CM | POA: Insufficient documentation

## 2021-08-06 DIAGNOSIS — Z833 Family history of diabetes mellitus: Secondary | ICD-10-CM | POA: Insufficient documentation

## 2021-08-06 DIAGNOSIS — Z801 Family history of malignant neoplasm of trachea, bronchus and lung: Secondary | ICD-10-CM

## 2021-08-06 DIAGNOSIS — Z8379 Family history of other diseases of the digestive system: Secondary | ICD-10-CM | POA: Insufficient documentation

## 2021-08-06 DIAGNOSIS — C50411 Malignant neoplasm of upper-outer quadrant of right female breast: Secondary | ICD-10-CM | POA: Insufficient documentation

## 2021-08-06 DIAGNOSIS — Z8249 Family history of ischemic heart disease and other diseases of the circulatory system: Secondary | ICD-10-CM | POA: Insufficient documentation

## 2021-08-06 DIAGNOSIS — Z8051 Family history of malignant neoplasm of kidney: Secondary | ICD-10-CM | POA: Insufficient documentation

## 2021-08-06 LAB — CBC WITH DIFFERENTIAL (CANCER CENTER ONLY)
Abs Immature Granulocytes: 0.03 10*3/uL (ref 0.00–0.07)
Basophils Absolute: 0.1 10*3/uL (ref 0.0–0.1)
Basophils Relative: 1 %
Eosinophils Absolute: 0.3 10*3/uL (ref 0.0–0.5)
Eosinophils Relative: 4 %
HCT: 37.6 % (ref 36.0–46.0)
Hemoglobin: 12.5 g/dL (ref 12.0–15.0)
Immature Granulocytes: 0 %
Lymphocytes Relative: 37 %
Lymphs Abs: 3.2 10*3/uL (ref 0.7–4.0)
MCH: 30.1 pg (ref 26.0–34.0)
MCHC: 33.2 g/dL (ref 30.0–36.0)
MCV: 90.6 fL (ref 80.0–100.0)
Monocytes Absolute: 0.7 10*3/uL (ref 0.1–1.0)
Monocytes Relative: 8 %
Neutro Abs: 4.3 10*3/uL (ref 1.7–7.7)
Neutrophils Relative %: 50 %
Platelet Count: 281 10*3/uL (ref 150–400)
RBC: 4.15 MIL/uL (ref 3.87–5.11)
RDW: 13.5 % (ref 11.5–15.5)
WBC Count: 8.6 10*3/uL (ref 4.0–10.5)
nRBC: 0 % (ref 0.0–0.2)

## 2021-08-06 LAB — CMP (CANCER CENTER ONLY)
ALT: 14 U/L (ref 0–44)
AST: 14 U/L — ABNORMAL LOW (ref 15–41)
Albumin: 4.1 g/dL (ref 3.5–5.0)
Alkaline Phosphatase: 56 U/L (ref 38–126)
Anion gap: 10 (ref 5–15)
BUN: 16 mg/dL (ref 8–23)
CO2: 24 mmol/L (ref 22–32)
Calcium: 9.6 mg/dL (ref 8.9–10.3)
Chloride: 108 mmol/L (ref 98–111)
Creatinine: 1.32 mg/dL — ABNORMAL HIGH (ref 0.44–1.00)
GFR, Estimated: 43 mL/min — ABNORMAL LOW (ref >60)
Glucose, Bld: 76 mg/dL (ref 70–99)
Potassium: 3.8 mmol/L (ref 3.5–5.1)
Sodium: 142 mmol/L (ref 135–145)
Total Bilirubin: 0.4 mg/dL (ref 0.3–1.2)
Total Protein: 6.8 g/dL (ref 6.5–8.1)

## 2021-08-06 LAB — GENETIC SCREENING ORDER

## 2021-08-06 NOTE — Patient Instructions (Signed)

## 2021-08-06 NOTE — Assessment & Plan Note (Signed)
07/29/2021:Screening mammogram: possible asymmetry in the right breast. Diagnostic mammogram and [place]: 1.1 cm irregular mass with spiculated margins and calcifications at 12:00 position. Biopsy: Grade 2 IDC and DCIS, ER+(100%)/PR-HER2 negative, Ki-67 10% Additional 0.5 cm lesion: Benign, axilla negative  Pathology and radiology counseling:Discussed with the patient, the details of pathology including the type of breast cancer,the clinical staging, the significance of ER, PR and HER-2/neu receptors and the implications for treatment. After reviewing the pathology in detail, we proceeded to discuss the different treatment options between surgery, radiation, chemotherapy, antiestrogen therapies.  Recommendations: 1. Breast conserving surgery followed by 2. Oncotype DX testing to determine if chemotherapy would be of any benefit followed by 3. Adjuvant radiation therapy followed by 4. Adjuvant antiestrogen therapy  Oncotype counseling: I discussed Oncotype DX test. I explained to the patient that this is a 21 gene panel to evaluate patient tumors DNA to calculate recurrence score. This would help determine whether patient has high risk or low risk breast cancer. She understands that if her tumor was found to be high risk, she would benefit from systemic chemotherapy. If low risk, no need of chemotherapy.  Return to clinic after surgery to discuss final pathology report and then determine if Oncotype DX testing will need to be sent.

## 2021-08-06 NOTE — Therapy (Signed)
[facility] @ [place], [place], [address] Phone: [phone]   Fax:  [phone]  Physical Therapy Evaluation  Patient Details  Name: April Houston MRN: [id number] Date of Birth: [birth year] Referring Provider (PT): Dr. Autumn Messing   Encounter Date: 08/06/2021   PT End of Session - 08/06/21 1540     Visit Number 1    Number of Visits 2    Date for PT Re-Evaluation 10/01/21    PT Start Time 5366    PT Stop Time 1406    PT Time Calculation (min) 30 min    Activity Tolerance Treatment limited secondary to agitation    Behavior During Therapy [facility] for tasks assessed/performed             Past Medical History:  Diagnosis Date   Anal fissure    Anal pain    Anemia    Anxiety    Blood transfusion without reported diagnosis 2002   After Nissen Fundiplication   Bradycardia    Breast cancer ([place])    Cataract    Chronic headaches    Depression    Endometriosis    GERD (gastroesophageal reflux disease)    Hiatal hernia    Hypertension    no meds/ in past   IBS (irritable bowel syndrome)    Lichen sclerosus    Pancreatitis    Panic attacks    Peptic ulcer    PVC (premature ventricular contraction)    SOB (shortness of breath)    with panic attack and anxiety   Wears glasses     Past Surgical History:  Procedure Laterality Date   CATARACT EXTRACTION, BILATERAL     Bil/ In 2018   LAPAROSCOPIC NISSEN FUNDOPLICATION  4403   and Repair Hiatal Hernia   LIPOMA EXCISION     RECTAL EXAM UNDER ANESTHESIA N/A 12/06/2015   Procedure: ANAL  EXAM UNDER ANESTHESIA, BIOPSY;  Surgeon: Leighton Ruff, MD;  Location: [place];  Service: General;  Laterality: N/A;   TONSILLECTOMY  age 75   TOTAL ABDOMINAL HYSTERECTOMY W/ BILATERAL SALPINGOOPHORECTOMY  1992    There were no vitals filed for this visit.    Subjective Assessment - 08/06/21 1505     Subjective Patient reports she is here  today to be seen by her medical team for her newly diagnosed right breast cancer.    Patient is accompained by: Family member    Pertinent History Patient was diagnosed on 06/26/2021 with right grade II invasive ductal carcinoma breast cancer. It measures 1 cm and is located in the upper outer quadrant. It is ER positive, PR negative, and HER2 negative with a Ki67 of 10%.    Patient Stated Goals Reduce lymphedema risk and learn post op HEP    Currently in Pain? No/denies                [facility] PT Assessment - 08/06/21 0001       Assessment   Medical Diagnosis Right breast cancer    Referring Provider (PT) Dr. Autumn Messing    Onset Date/Surgical Date 06/26/21    Hand Dominance Right    Prior Therapy none      Precautions   Precautions Other (comment)    Precaution Comments active cancer      Restrictions   Weight Bearing Restrictions No      Balance Screen   Has the patient fallen in the past 6 months No  Has the patient had a decrease in activity level because of a fear of falling?  No    Is the patient reluctant to leave their home because of a fear of falling?  No      Home Environment   Living Environment Private residence    Living Arrangements Alone   Her sister is moving in with her 08/23/2021   Available Help at Discharge Family      Prior Function   Level of Independence Independent    Vocation Full time employment    Vocation Requirements [employer]    Leisure She walks 3 miles per day      Cognition   Overall Cognitive Status Within Functional Limits for tasks assessed      Posture/Postural Control   Posture/Postural Control Postural limitations    Postural Limitations Rounded Shoulders;Forward head;Increased thoracic kyphosis      ROM / Strength   AROM / PROM / Strength AROM;Strength      AROM   Overall AROM Comments Cervical AROM is WNL    AROM Assessment Site Shoulder    Right/Left Shoulder Right;Left    Right Shoulder Extension 43  Degrees    Right Shoulder Flexion 144 Degrees    Right Shoulder ABduction 155 Degrees    Right Shoulder Internal Rotation 67 Degrees    Right Shoulder External Rotation 71 Degrees    Left Shoulder Extension 60 Degrees    Left Shoulder Flexion 156 Degrees    Left Shoulder ABduction 162 Degrees    Left Shoulder Internal Rotation 62 Degrees    Left Shoulder External Rotation 78 Degrees      Strength   Overall Strength Within functional limits for tasks performed               LYMPHEDEMA/ONCOLOGY QUESTIONNAIRE - 08/06/21 0001       Type   Cancer Type Right breast cancer      Lymphedema Assessments   Lymphedema Assessments Upper extremities      Right Upper Extremity Lymphedema   10 cm Proximal to Olecranon Process 25.5 cm    Olecranon Process 23.4 cm    10 cm Proximal to Ulnar Styloid Process 19.1 cm    Just Proximal to Ulnar Styloid Process 14 cm    Across Hand at [employer] 18.3 cm    At [place] of 2nd Digit 6.1 cm      Left Upper Extremity Lymphedema   10 cm Proximal to Olecranon Process 26 cm    Olecranon Process 23.2 cm    10 cm Proximal to Ulnar Styloid Process 17.8 cm    Just Proximal to Ulnar Styloid Process 13.9 cm    Across Hand at [employer] 17.3 cm    At [place] of 2nd Digit 6 cm             L-DEX FLOWSHEETS - 08/06/21 1500       L-DEX LYMPHEDEMA SCREENING   Measurement Type Unilateral    L-DEX MEASUREMENT EXTREMITY Upper Extremity    POSITION  Standing    DOMINANT SIDE Right    At Risk Side Right    BASELINE SCORE (UNILATERAL) -0.8             The patient was assessed using the L-Dex machine today to produce a lymphedema index baseline score. The patient will be reassessed on a regular basis (typically every 3 months) to obtain new L-Dex scores. If the score is > 6.5  points away from his/her baseline score indicating onset of subclinical lymphedema, it will be recommended to wear a compression garment for 4 weeks, 12 hours per day and  then be reassessed. If the score continues to be > 6.5 points from baseline at reassessment, we will initiate lymphedema treatment. Assessing in this manner has a 95% rate of preventing clinically significant lymphedema.      Katina Dung - 08/06/21 0001     Open a tight or new jar Moderate difficulty    Do heavy household chores (wash walls, wash floors) Moderate difficulty    Carry a shopping bag or briefcase Mild difficulty    Wash your back No difficulty    Use a knife to cut food No difficulty    Recreational activities in which you take some force or impact through your arm, shoulder, or hand (golf, hammering, tennis) Mild difficulty    During the past week, to what extent has your arm, shoulder or hand problem interfered with your normal social activities with family, friends, neighbors, or groups? Not at all    During the past week, to what extent has your arm, shoulder or hand problem limited your work or other regular daily activities Not at all    Arm, shoulder, or hand pain. None    Tingling (pins and needles) in your arm, shoulder, or hand None    Difficulty Sleeping No difficulty    DASH Score 13.64 %              Objective measurements completed on examination: See above findings.            Patient was instructed today in a home exercise program today for post op shoulder range of motion. These included active assist shoulder flexion in sitting, scapular retraction, wall walking with shoulder abduction, and hands behind head external rotation.  She was encouraged to do these twice a day, holding 3 seconds and repeating 5 times when permitted by her physician.      PT Education - 08/06/21 1526     Education Details Lymphedema education and post op HEP    Person(s) Educated Associate Professor)   sister   Methods Explanation;Demonstration;Handout    Comprehension Returned demonstration;Verbalized understanding                 PT Long Term Goals -  08/06/21 1545       PT LONG TERM GOAL #1   Title Patient will demonstrate she has regained full shoulder ROM and function post operatively compared to baselines.    Time 8    Period Weeks    Status New    Target Date 10/01/21             Breast Clinic Goals - 08/06/21 1544       Patient will be able to verbalize understanding of pertinent lymphedema risk reduction practices relevant to her diagnosis specifically related to skin care.   Time 1    Period Days    Status Achieved      Patient will be able to return demonstrate and/or verbalize understanding of the post-op home exercise program related to regaining shoulder range of motion.   Time 1    Period Days    Status Achieved      Patient will be able to verbalize understanding of the importance of attending the postoperative After Breast Cancer Class for further lymphedema risk reduction education and therapeutic exercise.   Time 1    Period  Days    Status Achieved                   Plan - 08/06/21 1540     Clinical Impression Statement Patient was diagnosed on 06/26/2021 with right grade II invasive ductal carcinoma breast cancer. It measures 1 cm and is located in the upper outer quadrant. It is ER positive, PR negative, and HER2 negative with a Ki67 of 10%. Her multidisciplinary medical team met prior to her assessments to determine a recommended treatment plan. She is planning to have a right lumpectomy and sentinel node biopsy folloewd by radiation and anti-estrogen therapy. She will benefit from a post op PT reassessment to determine needs and from L-Dex screens every 3 months for 2 years to detect subclinical lymphedema.    Stability/Clinical Decision Making Stable/Uncomplicated    Clinical Decision Making Low    Rehab Potential Excellent    PT Frequency --   Eval and post op visit   PT Treatment/Interventions ADLs/Self Care Home Management;Therapeutic exercise;Patient/family education    PT Next Visit Plan  Will reassess 3-4 weeks post op    PT Home Exercise Plan Post op shoulder ROM HEP    Consulted and Agree with Plan of Care Patient;Family member/caregiver    Family Member Consulted sister             Patient will benefit from skilled therapeutic intervention in order to improve the following deficits and impairments:  Postural dysfunction, Decreased range of motion, Decreased knowledge of precautions, Impaired UE functional use, Pain  Visit Diagnosis: Malignant neoplasm of upper-outer quadrant of right breast in female, estrogen receptor positive ([place]) - Plan: PT plan of care cert/re-cert  Abnormal posture - Plan: PT plan of care cert/re-cert  Patient will follow up at outpatient cancer rehab 3-4 weeks following surgery.  If the patient requires physical therapy at that time, a specific plan will be dictated and sent to the referring physician for approval. The patient was educated today on appropriate basic range of motion exercises to begin post operatively and the importance of attending the After Breast Cancer class following surgery.  Patient was educated today on lymphedema risk reduction practices as it pertains to recommendations that will benefit the patient immediately following surgery.  She verbalized good understanding.      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

## 2021-08-07 ENCOUNTER — Telehealth: Payer: Self-pay | Admitting: *Deleted

## 2021-08-07 ENCOUNTER — Encounter: Payer: Self-pay | Admitting: *Deleted

## 2021-08-07 NOTE — Telephone Encounter (Signed)
Return phone call to patient to follow up from [facility] 10/19.  Patient states she wishes to have bilateral mastectomies.  Informed her I would let the team know and Dr. Ethlyn Gallery office.  Patient denies any questions or concerns at this time. Encouraged her to call should anything arise.

## 2021-08-07 NOTE — Telephone Encounter (Signed)
Pt called stating she has diagnosed with Breast cancer . Patient states she d/c estradiol 10 mcg. As well as if she can donate her prescription to [place]. I called patient left a message for her to return our call.

## 2021-08-08 ENCOUNTER — Encounter: Payer: Self-pay | Admitting: Radiation Oncology

## 2021-08-08 ENCOUNTER — Encounter: Payer: Self-pay | Admitting: Obstetrics and Gynecology

## 2021-08-11 NOTE — Telephone Encounter (Signed)
Followed up with patient. She understands we don't take medications.

## 2021-08-12 ENCOUNTER — Telehealth: Payer: Self-pay | Admitting: Hematology and Oncology

## 2021-08-12 NOTE — Telephone Encounter (Signed)
Scheduled per sch msg. Called, not able to leave msg. Mailed printout  °

## 2021-08-19 ENCOUNTER — Ambulatory Visit: Payer: Self-pay | Admitting: General Surgery

## 2021-08-19 ENCOUNTER — Encounter: Payer: Self-pay | Admitting: *Deleted

## 2021-08-20 ENCOUNTER — Ambulatory Visit: Payer: 59 | Admitting: Internal Medicine

## 2021-08-20 ENCOUNTER — Encounter: Payer: Self-pay | Admitting: Internal Medicine

## 2021-08-20 VITALS — BP 118/74 | HR 63 | Ht 64.0 in | Wt 147.2 lb

## 2021-08-20 DIAGNOSIS — K5909 Other constipation: Secondary | ICD-10-CM | POA: Diagnosis not present

## 2021-08-20 DIAGNOSIS — R4702 Dysphasia: Secondary | ICD-10-CM | POA: Diagnosis not present

## 2021-08-20 DIAGNOSIS — K602 Anal fissure, unspecified: Secondary | ICD-10-CM

## 2021-08-20 DIAGNOSIS — K219 Gastro-esophageal reflux disease without esophagitis: Secondary | ICD-10-CM

## 2021-08-20 MED ORDER — OMEPRAZOLE 40 MG PO CPDR: 40.0000 mg | DELAYED_RELEASE_CAPSULE | Freq: Two times a day (BID) | ORAL | 3 refills | Status: DC

## 2021-08-20 NOTE — Progress Notes (Signed)
HISTORY OF PRESENT ILLNESS:  April Houston is a 75 y.o. female with chronic GERD status post fundoplication with redo fundoplication, chronic dysphagia secondary to both esophageal stricturing as well as dysmotility, chronic intermittent raspy voice, and up-to-date colorectal neoplasia screening.  I last saw the patient August 20, 2020.  The patient was last seen in this office on June 10, 2021 by the GI physician assistant regarding anal fissure and chronic constipation.  See that dictation.  She was prescribed diltiazem ointment.  She has been using MiraLAX for her bowels.  The problems with her fissure have resolved.  MiraLAX is helping her constipation.  She continues on omeprazole twice daily as well as on-demand Pepcid for her GERD.  She does get breakthrough symptoms.  She does have a history of gastroparesis.  Overall symptoms controlled.  She was diagnosed with breast cancer 2 weeks ago.  Anticipating surgery in December.  GI review of systems is otherwise negative  REVIEW OF SYSTEMS:  All non-GI ROS negative unless otherwise stated in the HPI except for anxiety, headaches, confusion, depression, fatigue, sleeping problems  Past Medical History:  Diagnosis Date   Anal fissure    Anal pain    Anemia    Anxiety    Blood transfusion without reported diagnosis 2002   After Nissen Fundiplication   Bradycardia    Breast cancer ([facility])    Cataract    Chronic headaches    Depression    Endometriosis    GERD (gastroesophageal reflux disease)    Hiatal hernia    Hypertension    no meds/ in past   IBS (irritable bowel syndrome)    Lichen sclerosus    Pancreatitis    Panic attacks    Peptic ulcer    PVC (premature ventricular contraction)    SOB (shortness of breath)    with panic attack and anxiety   Wears glasses     Past Surgical History:  Procedure Laterality Date   CATARACT EXTRACTION, BILATERAL     Bil/ In 2018   LAPAROSCOPIC NISSEN FUNDOPLICATION  3557   and Repair  Hiatal Hernia   LIPOMA EXCISION     RECTAL EXAM UNDER ANESTHESIA N/A 12/06/2015   Procedure: ANAL  EXAM UNDER ANESTHESIA, BIOPSY;  Surgeon: Leighton Ruff, MD;  Location: [place];  Service: General;  Laterality: N/A;   TONSILLECTOMY  age 11   TOTAL ABDOMINAL HYSTERECTOMY W/ BILATERAL SALPINGOOPHORECTOMY  1992    Social History April Houston  reports that she has never smoked. She has never used smokeless tobacco. She reports that she does not currently use alcohol. She reports that she does not use drugs.  family history includes Cystic fibrosis in an other family member; Diabetes in her mother; Hyperlipidemia in her mother; Hypertension in her mother; Irritable bowel syndrome in her mother, sister, and sister; Kidney cancer in her mother; Lung cancer in her father; Stroke in her mother.  Allergies  Allergen Reactions   Hydrocodone Itching    severe   Other Other (See Comments)    Severe headaches w/ most gerd medication IV and PO    Zantac [Ranitidine Hcl] Other (See Comments)    headaches       PHYSICAL EXAMINATION: Vital signs: BP 118/74   Pulse 63   Ht 5\' 4"  (1.626 m)   Wt 147 lb 3.2 oz (66.8 kg)   SpO2 93%   BMI 25.27 kg/m   Constitutional: generally well-appearing, no acute distress Psychiatric: alert and oriented  x3, cooperative Eyes: extraocular movements intact, anicteric, conjunctiva pink Mouth: Mask Neck: supple no lymphadenopathy Cardiovascular: heart regular rate and rhythm, no murmur Lungs: clear to auscultation bilaterally Abdomen: soft, nontender, nondistended, no obvious ascites, no peritoneal signs, normal bowel sounds, no organomegaly Rectal: Omitted Extremities: no clubbing, cyanosis, or lower extremity edema bilaterally Skin: no lesions on visible extremities Neuro: No focal deficits.  Cranial nerves intact  ASSESSMENT:  1.  GERD.  Prior fundoplication as well as redo fundoplication.  Some breakthrough symptoms. 2.  Chronic  dysphagia.  Combination of stricture and dysmotility. 3.  Esophageal diverticulum 4.  Gastroparesis 5.  Chronic constipation 6.  History of anal fissure 7.  Colonoscopy 2015 negative for neoplasia   PLAN:  1.  Reflux precautions 2.  Continue antireflux medication regimen.  Medication refilled.  Medication risks reviewed 3.  Continue MiraLAX for constipation 4.  Again, advised for formal ENT evaluation for her raspy voice 5.  Routine office follow-up 1 year

## 2021-08-20 NOTE — Patient Instructions (Signed)
If you are age 73 or older, your body mass index should be between 23-30. Your Body mass index is 25.27 kg/m. If this is out of the aforementioned range listed, please consider follow up with your Primary Care Provider.  If you are age 68 or younger, your body mass index should be between 19-25. Your Body mass index is 25.27 kg/m. If this is out of the aformentioned range listed, please consider follow up with your Primary Care Provider.   ________________________________________________________  The [facility] providers would like to encourage you to use [facility] to communicate with providers for non-urgent requests or questions.  Due to long hold times on the telephone, sending your provider a message by [facility] may be a faster and more efficient way to get a response.  Please allow 48 business hours for a response.  Please remember that this is for non-urgent requests.  _______________________________________________________  We have sent the following medications to your pharmacy for you to pick up at your convenience:  Omeprazole  Please follow up in one year

## 2021-08-22 ENCOUNTER — Other Ambulatory Visit: Payer: Self-pay | Admitting: Obstetrics and Gynecology

## 2021-08-22 DIAGNOSIS — N952 Postmenopausal atrophic vaginitis: Secondary | ICD-10-CM

## 2021-08-22 DIAGNOSIS — N941 Unspecified dyspareunia: Secondary | ICD-10-CM

## 2021-08-26 ENCOUNTER — Encounter: Payer: Self-pay | Admitting: Obstetrics and Gynecology

## 2021-08-26 ENCOUNTER — Other Ambulatory Visit: Payer: Self-pay

## 2021-08-26 ENCOUNTER — Telehealth: Payer: Self-pay

## 2021-08-26 ENCOUNTER — Ambulatory Visit: Payer: 59 | Admitting: Obstetrics and Gynecology

## 2021-08-26 VITALS — BP 98/68 | HR 88 | Wt 147.0 lb

## 2021-08-26 DIAGNOSIS — C50411 Malignant neoplasm of upper-outer quadrant of right female breast: Secondary | ICD-10-CM

## 2021-08-26 DIAGNOSIS — Z170 Estrogen receptor positive status [ER+]: Secondary | ICD-10-CM | POA: Diagnosis not present

## 2021-08-26 DIAGNOSIS — N941 Unspecified dyspareunia: Secondary | ICD-10-CM

## 2021-08-26 DIAGNOSIS — N898 Other specified noninflammatory disorders of vagina: Secondary | ICD-10-CM

## 2021-08-26 NOTE — Telephone Encounter (Signed)
Patient called for name of vaginal moisturizer that Dr. Talbert Nan had mentioned at her visit this morning. Per visit note patient was provided this info: Instructions  Try uberlube for vaginal lubrication with intercourse.   Try Replens vaginal moisturizer

## 2021-08-26 NOTE — Progress Notes (Signed)
GYNECOLOGY  VISIT   HPI: 75 y.o.   Divorced [place] or Caucasian Not Hispanic or Latino  female   G0P0000 with No LMP recorded. Patient has had a hysterectomy.   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  She has seen the Oncologist and the Breast Surgeon and has decided on bilateral mastectomies (scheduled next month). She doesn't want radiation, chemotherapy or antiestrogen therapy.  She is here to discuss her breast cancer.   Since her breast cancer diagnosis she has stopped her vaginal estrogen. She c/o vaginal dryness and discomfort with intercourse.   GYNECOLOGIC HISTORY: No LMP recorded. Patient has had a hysterectomy. Contraception:PMP Menopausal hormone therapy: none         OB History     Gravida  0   Para  0   Term  0   Preterm  0   AB  0   Living  0      SAB  0   IAB  0   Ectopic  0   Multiple  0   Live Births  0              Patient Active Problem List   Diagnosis Date Noted   Malignant neoplasm of upper-outer quadrant of right breast in female, estrogen receptor positive ([place]) 07/31/2021   Heartburn 12/04/2020   Migraines 12/04/2020   Pure hypercholesterolemia 10/01/2020   Laryngopharyngeal reflux (LPR) 09/05/2020   Irritable bowel syndrome with constipation 03/26/2020   Hair loss 10/31/2019   Lipid screening 04/14/2019   Malaise 04/14/2019   Major depressive disorder, recurrent, 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   Gastroesophageal reflux disease without esophagitis 02/10/2018   Vitamin D deficiency 02/10/2018   Pain of left lower extremity 09/15/2017   Phlebitis 08/12/2017   Panic disorder 05/30/2015   Persistent insomnia 01/22/2014   Hyperlipidemia 12/02/2013   Mixed emotional features as adjustment reaction 12/02/2013    Past Medical History:  Diagnosis Date   Anal fissure    Anal pain    Anemia    Anxiety    Blood transfusion without reported diagnosis 2002    After Nissen Fundiplication   Bradycardia    Breast cancer (HCC)    Cataract    Chronic headaches    Depression    Endometriosis    GERD (gastroesophageal reflux disease)    Hiatal hernia    Hypertension    no meds/ in past   IBS (irritable bowel syndrome)    Lichen sclerosus    Pancreatitis    Panic attacks    Peptic ulcer    PVC (premature ventricular contraction)    SOB (shortness of breath)    with panic attack and anxiety   Wears glasses     Past Surgical History:  Procedure Laterality Date   CATARACT EXTRACTION, BILATERAL     Bil/ In 2018   LAPAROSCOPIC NISSEN FUNDOPLICATION  5631   and Repair Hiatal Hernia   LIPOMA EXCISION     RECTAL EXAM UNDER ANESTHESIA N/A 12/06/2015   Procedure: ANAL  EXAM UNDER ANESTHESIA, BIOPSY;  Surgeon: Leighton Ruff, MD;  Location: [facility];  Service: General;  Laterality: N/A;   TONSILLECTOMY  age 58   TOTAL ABDOMINAL HYSTERECTOMY W/ BILATERAL SALPINGOOPHORECTOMY  1992    Current Outpatient Medications  Medication Sig Dispense Refill   ALPRAZolam (XANAX) 1 MG tablet Take 1 tab 3 times a day  diltiazem 2 % GEL Apply 1 application topically 3 (three) times daily. 30 g 1   eszopiclone (LUNESTA) 2 MG TABS tablet Take 2 mg by mouth at bedtime.     FLUoxetine (PROZAC) 10 MG capsule Take 1 cap twice a day     hydrOXYzine (ATARAX/VISTARIL) 25 MG tablet Take 1 tab tid for anxiety (dose changed)     omeprazole (PRILOSEC) 40 MG capsule Take 1 capsule (40 mg total) by mouth 2 (two) times daily. 180 capsule 3   polyethylene glycol powder (GLYCOLAX/MIRALAX) powder Take 17 g by mouth 2 (two) times daily. 255 g 0   Vitamin D, Ergocalciferol, (DRISDOL) 1.25 MG (50000 UNIT) CAPS capsule Take 50,000 Units by mouth once a week.     topiramate (TOPAMAX) 25 MG tablet Take 1 tab q hs x 7d, then 2 q hs (Patient not taking: Reported on 08/26/2021)     No current facility-administered medications for this visit.     ALLERGIES:  Hydrocodone, Other, and Zantac [ranitidine hcl]  Family History  Problem Relation Age of Onset   Hypertension Mother    Hyperlipidemia Mother    Stroke Mother    Diabetes Mother    Irritable bowel syndrome Mother    Kidney cancer Mother    Lung cancer Father    Irritable bowel syndrome Sister    Cystic fibrosis Other    Irritable bowel syndrome Sister    Esophageal cancer Neg Hx    Stomach cancer Neg Hx    Rectal cancer Neg Hx    Colon cancer Neg Hx     Social History   Socioeconomic History   Marital status: Divorced    Spouse name: Not on file   Number of children: 0   Years of education: Not on file   Highest education level: Not on file  Occupational History   Occupation: Admin Assist  Tobacco Use   Smoking status: Never   Smokeless tobacco: Never  Vaping Use   Vaping Use: Never used  Substance and Sexual Activity   Alcohol use: Not Currently   Drug use: No   Sexual activity: Yes    Partners: Male    Birth control/protection: Post-menopausal  Other Topics Concern   Not on file  Social History Narrative   Not on file   Social Determinants of Health   Financial Resource Strain: Not on file  Food Insecurity: Not on file  Transportation Needs: Not on file  Physical Activity: Not on file  Stress: Not on file  Social Connections: Not on file  Intimate Partner Violence: Not on file    Review of Systems  All other systems reviewed and are negative.  PHYSICAL EXAMINATION:    BP 98/68   Pulse 88   Wt 147 lb (66.7 kg)   SpO2 100%   BMI 25.23 kg/m     General appearance: alert, cooperative and appears stated age  2. Malignant neoplasm of upper-outer quadrant of right breast in female, estrogen receptor positive ([place]) She is here to discuss her diagnosis and chosen treatment. She is aware that the Oncologist and Breast Surgeons will guide her as to her options.  2. Vaginal dryness Stopped her vaginal estrogen. Can try Replens vaginal moisturizer  3.  Dyspareunia, female Try Uberlube for lubrication

## 2021-08-26 NOTE — Patient Instructions (Signed)
Try uberlube for vaginal lubrication with intercourse.  Try Replens vaginal moisturizer

## 2021-09-04 ENCOUNTER — Encounter: Payer: Self-pay | Admitting: *Deleted

## 2021-09-16 DIAGNOSIS — D0511 Intraductal carcinoma in situ of right breast: Secondary | ICD-10-CM | POA: Insufficient documentation

## 2021-09-24 ENCOUNTER — Encounter: Payer: Self-pay | Admitting: General Practice

## 2021-09-24 NOTE — Progress Notes (Signed)
[place] Psychosocial Distress Screening Spiritual Care  Met with April Houston by phone following [facility] to introduce [place] team/resources, reviewing distress screen per protocol.  The patient scored a 10 on the Psychosocial Distress Thermometer which indicates severe distress. Also assessed for distress and other psychosocial needs.   ONCBCN DISTRESS SCREENING 09/24/2021  Screening Type Initial Screening  Distress experienced in past week (1-10) 10  Practical problem type Housing;Insurance;Work/school;Food  Emotional problem type Depression;Nervousness/Anxiety;Adjusting to illness;Isolation/feeling alone;Feeling hopeless;Boredom;Adjusting to appearance changes  Information Concerns Type Lack of info about diagnosis;Lack of info about treatment;Lack of info about complementary therapy choices  Physical Problem type Pain;Nausea/vomiting;Sleep/insomnia;Bathing/dressing;Constipation/diarrhea;Skin dry/itchy  Referral to support programs Yes   April Houston reports that having the busyness of her work helps her cope with her baseline anxiety and worries about surgery. Her big dog is another source of support and care, and her sister is coming for three weeks to help care for them both postoperatively. She also notes that medication and counseling help with anxiety and depression. 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   Follow up needed: Yes.  We plan to follow up by phone next week prior to surgery.   [place], [place], [facility] Pager [phone] Voicemail [phone]

## 2021-09-25 ENCOUNTER — Other Ambulatory Visit: Payer: Self-pay

## 2021-09-25 ENCOUNTER — Encounter (HOSPITAL_BASED_OUTPATIENT_CLINIC_OR_DEPARTMENT_OTHER): Payer: Self-pay | Admitting: General Surgery

## 2021-09-30 ENCOUNTER — Encounter: Payer: Self-pay | Admitting: General Practice

## 2021-09-30 NOTE — Progress Notes (Signed)

## 2021-09-30 NOTE — Progress Notes (Signed)
[place] Spiritual Care Note  Reached April Houston by phone to wish her a happy birthday and check in preoperatively. She reports eagerness and anxiety: "I'm ready to get [the surgery] over with...and I'm anxious because I know it will change my life, but I just don't know how yet." Provided empathic listening, normalization of feelings, and suggestion of Pharmacist, hospital as resource for learning and processing; April Houston plans to consider this.  We plan to follow up again by phone postoperatively for pastoral check-in.   [place], [place], [facility] Pager [phone] Voicemail [phone]

## 2021-10-02 ENCOUNTER — Ambulatory Visit (HOSPITAL_BASED_OUTPATIENT_CLINIC_OR_DEPARTMENT_OTHER): Payer: 59 | Admitting: Anesthesiology

## 2021-10-02 ENCOUNTER — Ambulatory Visit (HOSPITAL_BASED_OUTPATIENT_CLINIC_OR_DEPARTMENT_OTHER)
Admission: RE | Admit: 2021-10-02 | Discharge: 2021-10-03 | Disposition: A | Discharge status: Home / Self Care | Payer: 59 | Source: Ambulatory Visit | Attending: General Surgery | Admitting: General Surgery

## 2021-10-02 ENCOUNTER — Encounter (HOSPITAL_BASED_OUTPATIENT_CLINIC_OR_DEPARTMENT_OTHER): Payer: Self-pay | Admitting: General Surgery

## 2021-10-02 ENCOUNTER — Other Ambulatory Visit: Payer: Self-pay

## 2021-10-02 ENCOUNTER — Encounter (HOSPITAL_BASED_OUTPATIENT_CLINIC_OR_DEPARTMENT_OTHER)
Admission: RE | Disposition: A | Discharge status: Home / Self Care | Payer: Self-pay | Source: Ambulatory Visit | Attending: General Surgery

## 2021-10-02 DIAGNOSIS — F32A Depression, unspecified: Secondary | ICD-10-CM | POA: Diagnosis not present

## 2021-10-02 DIAGNOSIS — Z170 Estrogen receptor positive status [ER+]: Secondary | ICD-10-CM | POA: Insufficient documentation

## 2021-10-02 DIAGNOSIS — C50911 Malignant neoplasm of unspecified site of right female breast: Secondary | ICD-10-CM | POA: Diagnosis present

## 2021-10-02 DIAGNOSIS — I10 Essential (primary) hypertension: Secondary | ICD-10-CM | POA: Insufficient documentation

## 2021-10-02 DIAGNOSIS — C50411 Malignant neoplasm of upper-outer quadrant of right female breast: Secondary | ICD-10-CM | POA: Insufficient documentation

## 2021-10-02 DIAGNOSIS — Z79899 Other long term (current) drug therapy: Secondary | ICD-10-CM | POA: Diagnosis not present

## 2021-10-02 DIAGNOSIS — K219 Gastro-esophageal reflux disease without esophagitis: Secondary | ICD-10-CM | POA: Diagnosis not present

## 2021-10-02 DIAGNOSIS — K449 Diaphragmatic hernia without obstruction or gangrene: Secondary | ICD-10-CM | POA: Diagnosis not present

## 2021-10-02 DIAGNOSIS — F419 Anxiety disorder, unspecified: Secondary | ICD-10-CM | POA: Insufficient documentation

## 2021-10-02 DIAGNOSIS — R519 Headache, unspecified: Secondary | ICD-10-CM | POA: Diagnosis not present

## 2021-10-02 HISTORY — PX: TOTAL MASTECTOMY: SHX6129

## 2021-10-02 HISTORY — PX: MASTECTOMY W/ SENTINEL NODE BIOPSY: SHX2001

## 2021-10-02 SURGERY — MASTECTOMY WITH SENTINEL LYMPH NODE BIOPSY
Anesthesia: General | Site: Breast | Laterality: Right

## 2021-10-02 MED ORDER — OXYCODONE HCL 5 MG PO TABS
5.0000 mg | ORAL_TABLET | Freq: Once | ORAL | Status: AC | PRN
  Administered 2021-10-03: 5 mg via ORAL
  Filled 2021-10-02: qty 1

## 2021-10-02 MED ORDER — FENTANYL CITRATE (PF) 100 MCG/2ML IJ SOLN: 12.5000 ug | INTRAMUSCULAR | Status: DC | PRN

## 2021-10-02 MED ORDER — BUPIVACAINE HCL (PF) 0.25 % IJ SOLN
INTRAMUSCULAR | Status: DC | PRN
  Administered 2021-10-02: 60 mL via PERINEURAL

## 2021-10-02 MED ORDER — ACETAMINOPHEN 500 MG PO TABS
ORAL_TABLET | ORAL | Status: AC
  Filled 2021-10-02: qty 2

## 2021-10-02 MED ORDER — MIDAZOLAM HCL 2 MG/2ML IJ SOLN
2.0000 mg | Freq: Once | INTRAMUSCULAR | Status: AC
  Administered 2021-10-02: 2 mg via INTRAVENOUS

## 2021-10-02 MED ORDER — ONDANSETRON HCL 4 MG/2ML IJ SOLN
INTRAMUSCULAR | Status: AC
  Filled 2021-10-02: qty 2

## 2021-10-02 MED ORDER — HYDROMORPHONE HCL 1 MG/ML IJ SOLN
0.2500 mg | INTRAMUSCULAR | Status: DC | PRN
  Administered 2021-10-02 (×2): 0.5 mg via INTRAVENOUS

## 2021-10-02 MED ORDER — CHLORHEXIDINE GLUCONATE CLOTH 2 % EX PADS: 6.0000 | MEDICATED_PAD | Freq: Once | CUTANEOUS | Status: DC

## 2021-10-02 MED ORDER — TRAMADOL HCL 50 MG PO TABS
50.0000 mg | ORAL_TABLET | Freq: Four times a day (QID) | ORAL | Status: DC | PRN
  Administered 2021-10-02 – 2021-10-03 (×3): 50 mg via ORAL
  Filled 2021-10-02 (×3): qty 1

## 2021-10-02 MED ORDER — DEXAMETHASONE SODIUM PHOSPHATE 4 MG/ML IJ SOLN
INTRAMUSCULAR | Status: DC | PRN
  Administered 2021-10-02: 5 mg via INTRAVENOUS

## 2021-10-02 MED ORDER — OXYCODONE HCL 5 MG/5ML PO SOLN: 5.0000 mg | Freq: Once | ORAL | Status: AC | PRN

## 2021-10-02 MED ORDER — PROPOFOL 500 MG/50ML IV EMUL
INTRAVENOUS | Status: AC
  Filled 2021-10-02: qty 50

## 2021-10-02 MED ORDER — PROPOFOL 10 MG/ML IV BOLUS
INTRAVENOUS | Status: DC | PRN
  Administered 2021-10-02: 200 mg via INTRAVENOUS

## 2021-10-02 MED ORDER — HEPARIN SODIUM (PORCINE) 5000 UNIT/ML IJ SOLN
5000.0000 [IU] | Freq: Three times a day (TID) | INTRAMUSCULAR | Status: DC
  Administered 2021-10-03: 5000 [IU] via SUBCUTANEOUS

## 2021-10-02 MED ORDER — FENTANYL CITRATE (PF) 100 MCG/2ML IJ SOLN
100.0000 ug | Freq: Once | INTRAMUSCULAR | Status: AC
  Administered 2021-10-02: 100 ug via INTRAVENOUS

## 2021-10-02 MED ORDER — HYDROMORPHONE HCL 1 MG/ML IJ SOLN
INTRAMUSCULAR | Status: AC
  Filled 2021-10-02: qty 0.5

## 2021-10-02 MED ORDER — FLUOXETINE HCL 10 MG PO CAPS
10.0000 mg | ORAL_CAPSULE | Freq: Two times a day (BID) | ORAL | Status: DC
  Administered 2021-10-02: 10 mg via ORAL
  Filled 2021-10-02: qty 1

## 2021-10-02 MED ORDER — ONDANSETRON HCL 4 MG/2ML IJ SOLN: 4.0000 mg | Freq: Four times a day (QID) | INTRAMUSCULAR | Status: DC | PRN

## 2021-10-02 MED ORDER — LIDOCAINE HCL (CARDIAC) PF 100 MG/5ML IV SOSY
PREFILLED_SYRINGE | INTRAVENOUS | Status: DC | PRN
  Administered 2021-10-02: 40 mg via INTRAVENOUS

## 2021-10-02 MED ORDER — DEXAMETHASONE SODIUM PHOSPHATE 10 MG/ML IJ SOLN
INTRAMUSCULAR | Status: AC
  Filled 2021-10-02: qty 1

## 2021-10-02 MED ORDER — EPHEDRINE SULFATE 50 MG/ML IJ SOLN
INTRAMUSCULAR | Status: DC | PRN
  Administered 2021-10-02 (×4): 10 mg via INTRAVENOUS

## 2021-10-02 MED ORDER — CELECOXIB 200 MG PO CAPS
200.0000 mg | ORAL_CAPSULE | ORAL | Status: AC
  Administered 2021-10-02: 200 mg via ORAL

## 2021-10-02 MED ORDER — AMISULPRIDE (ANTIEMETIC) 5 MG/2ML IV SOLN: 10.0000 mg | Freq: Once | INTRAVENOUS | Status: DC | PRN

## 2021-10-02 MED ORDER — PROMETHAZINE HCL 25 MG/ML IJ SOLN: 6.2500 mg | INTRAMUSCULAR | Status: DC | PRN

## 2021-10-02 MED ORDER — MAGTRACE LYMPHATIC TRACER
INTRAMUSCULAR | Status: DC | PRN
  Administered 2021-10-02: 2 mL via INTRAMUSCULAR

## 2021-10-02 MED ORDER — METHOCARBAMOL 500 MG PO TABS
500.0000 mg | ORAL_TABLET | Freq: Four times a day (QID) | ORAL | Status: DC | PRN
  Administered 2021-10-02 – 2021-10-03 (×3): 500 mg via ORAL
  Filled 2021-10-02 (×3): qty 1

## 2021-10-02 MED ORDER — FENTANYL CITRATE (PF) 100 MCG/2ML IJ SOLN
INTRAMUSCULAR | Status: AC
  Filled 2021-10-02: qty 2

## 2021-10-02 MED ORDER — LACTATED RINGERS IV SOLN: INTRAVENOUS | Status: DC

## 2021-10-02 MED ORDER — EPHEDRINE 5 MG/ML INJ
INTRAVENOUS | Status: AC
  Filled 2021-10-02: qty 5

## 2021-10-02 MED ORDER — ONDANSETRON 4 MG PO TBDP: 4.0000 mg | ORAL_TABLET | Freq: Four times a day (QID) | ORAL | Status: DC | PRN

## 2021-10-02 MED ORDER — ALPRAZOLAM 0.25 MG PO TABS
1.0000 mg | ORAL_TABLET | Freq: Three times a day (TID) | ORAL | Status: DC
  Administered 2021-10-02 (×2): 1 mg via ORAL
  Filled 2021-10-02: qty 4

## 2021-10-02 MED ORDER — ONDANSETRON HCL 4 MG/2ML IJ SOLN
INTRAMUSCULAR | Status: DC | PRN
  Administered 2021-10-02: 4 mg via INTRAVENOUS

## 2021-10-02 MED ORDER — GABAPENTIN 300 MG PO CAPS
ORAL_CAPSULE | ORAL | Status: AC
  Filled 2021-10-02: qty 1

## 2021-10-02 MED ORDER — CEFAZOLIN SODIUM-DEXTROSE 2-4 GM/100ML-% IV SOLN
2.0000 g | INTRAVENOUS | Status: AC
  Administered 2021-10-02: 2 g via INTRAVENOUS

## 2021-10-02 MED ORDER — MIDAZOLAM HCL 2 MG/2ML IJ SOLN
INTRAMUSCULAR | Status: AC
  Filled 2021-10-02: qty 2

## 2021-10-02 MED ORDER — ACETAMINOPHEN 500 MG PO TABS
1000.0000 mg | ORAL_TABLET | ORAL | Status: AC
  Administered 2021-10-02: 1000 mg via ORAL

## 2021-10-02 MED ORDER — GABAPENTIN 300 MG PO CAPS
300.0000 mg | ORAL_CAPSULE | ORAL | Status: AC
  Administered 2021-10-02: 300 mg via ORAL

## 2021-10-02 MED ORDER — CELECOXIB 200 MG PO CAPS
ORAL_CAPSULE | ORAL | Status: AC
  Filled 2021-10-02: qty 1

## 2021-10-02 MED ORDER — CEFAZOLIN SODIUM-DEXTROSE 2-4 GM/100ML-% IV SOLN
INTRAVENOUS | Status: AC
  Filled 2021-10-02: qty 100

## 2021-10-02 MED ORDER — 0.9 % SODIUM CHLORIDE (POUR BTL) OPTIME
TOPICAL | Status: DC | PRN
  Administered 2021-10-02: 1000 mL

## 2021-10-02 MED ORDER — SODIUM CHLORIDE 0.9 % IV SOLN: INTRAVENOUS | Status: DC

## 2021-10-02 MED ORDER — PANTOPRAZOLE SODIUM 40 MG PO TBEC: 40.0000 mg | DELAYED_RELEASE_TABLET | Freq: Every day | ORAL | Status: DC

## 2021-10-02 SURGICAL SUPPLY — 51 items
ADH SKN CLS APL DERMABOND .7 (GAUZE/BANDAGES/DRESSINGS) ×4
APL PRP STRL LF DISP 70% ISPRP (MISCELLANEOUS) ×4
APPLIER CLIP 9.375 MED OPEN (MISCELLANEOUS) ×4
APR CLP MED 9.3 20 MLT OPN (MISCELLANEOUS) ×2
BINDER BREAST LRG (GAUZE/BANDAGES/DRESSINGS) ×2 IMPLANT
BIOPATCH RED 1 DISK 7.0 (GAUZE/BANDAGES/DRESSINGS) ×4 IMPLANT
BIOPATCH RED 1IN DISK 7.0MM (GAUZE/BANDAGES/DRESSINGS) ×2
BLADE SURG 10 STRL SS (BLADE) ×4 IMPLANT
BLADE SURG 15 STRL LF DISP TIS (BLADE) ×2 IMPLANT
BLADE SURG 15 STRL SS (BLADE) ×4
CANISTER SUCT 1200ML W/VALVE (MISCELLANEOUS) ×4 IMPLANT
CHLORAPREP W/TINT 26 (MISCELLANEOUS) ×6 IMPLANT
CLIP APPLIE 9.375 MED OPEN (MISCELLANEOUS) ×2 IMPLANT
COVER BACK TABLE 60X90IN (DRAPES) ×4 IMPLANT
COVER MAYO STAND STRL (DRAPES) ×4 IMPLANT
COVER PROBE W GEL 5X96 (DRAPES) ×4 IMPLANT
DERMABOND ADVANCED (GAUZE/BANDAGES/DRESSINGS) ×4
DERMABOND ADVANCED .7 DNX12 (GAUZE/BANDAGES/DRESSINGS) ×2 IMPLANT
DEVICE DSSCT PLSMBLD 3.0S LGHT (MISCELLANEOUS) ×2 IMPLANT
DRAIN CHANNEL 19F RND (DRAIN) ×6 IMPLANT
DRAPE LAPAROSCOPIC ABDOMINAL (DRAPES) ×4 IMPLANT
DRAPE UTILITY XL STRL (DRAPES) ×4 IMPLANT
DRSG PAD ABDOMINAL 8X10 ST (GAUZE/BANDAGES/DRESSINGS) ×6 IMPLANT
DRSG TEGADERM 2-3/8X2-3/4 SM (GAUZE/BANDAGES/DRESSINGS) ×6 IMPLANT
ELECT REM PT RETURN 9FT ADLT (ELECTROSURGICAL) ×4
ELECTRODE REM PT RTRN 9FT ADLT (ELECTROSURGICAL) ×2 IMPLANT
EVACUATOR SILICONE 100CC (DRAIN) ×6 IMPLANT
GAUZE SPONGE 4X4 12PLY STRL LF (GAUZE/BANDAGES/DRESSINGS) ×6 IMPLANT
GLOVE SURG ENC MOIS LTX SZ7.5 (GLOVE) ×6 IMPLANT
GLOVE SURG UNDER POLY LF SZ6.5 (GLOVE) ×2 IMPLANT
GLOVE SURG UNDER POLY LF SZ7 (GLOVE) ×2 IMPLANT
GOWN STRL REUS W/ TWL LRG LVL3 (GOWN DISPOSABLE) ×4 IMPLANT
GOWN STRL REUS W/TWL LRG LVL3 (GOWN DISPOSABLE) ×8
NDL HYPO 25X1 1.5 SAFETY (NEEDLE) ×2 IMPLANT
NEEDLE HYPO 25X1 1.5 SAFETY (NEEDLE) ×4 IMPLANT
NS IRRIG 1000ML POUR BTL (IV SOLUTION) ×4 IMPLANT
PACK BASIN DAY SURGERY FS (CUSTOM PROCEDURE TRAY) ×4 IMPLANT
PIN SAFETY STERILE (MISCELLANEOUS) ×4 IMPLANT
PLASMABLADE 3.0S W/LIGHT (MISCELLANEOUS) ×4
SLEEVE SCD COMPRESS KNEE MED (STOCKING) ×4 IMPLANT
SPONGE T-LAP 18X18 ~~LOC~~+RFID (SPONGE) ×6 IMPLANT
SUT ETHILON 2 0 FS 18 (SUTURE) ×6 IMPLANT
SUT MNCRL AB 4-0 PS2 18 (SUTURE) ×6 IMPLANT
SUT SILK 2 0 SH (SUTURE) ×2 IMPLANT
SUT VICRYL 3-0 CR8 SH (SUTURE) ×6 IMPLANT
SYR CONTROL 10ML LL (SYRINGE) ×4 IMPLANT
TOWEL GREEN STERILE FF (TOWEL DISPOSABLE) ×4 IMPLANT
TRACER MAGTRACE VIAL (MISCELLANEOUS) ×2 IMPLANT
TUBE CONNECTING 20'X1/4 (TUBING) ×1
TUBE CONNECTING 20X1/4 (TUBING) ×3 IMPLANT
YANKAUER SUCT BULB TIP NO VENT (SUCTIONS) ×4 IMPLANT

## 2021-10-02 NOTE — H&P (Signed)
REFERRING PHYSICIAN: [facility] *  PROVIDER: Landry Corporal, MD  MRN: [id number] DOB: 1945/10/28 Subjective  Chief Complaint: No chief complaint on file.   History of Present Illness: April Houston is a 75 y.o. female who is seen today as an office consultation at the request of Dr. Imaging for evaluation of No chief complaint on file. .   We are asked to see the patient in consultation by Dr. Lindi Houston to evaluate her for a new right breast cancer. The patient is a 75 year old April Houston female who recently went for a routine screening mammogram. At that time she was found to have a 1 cm mass on the upper portion of the right breast. The lymph nodes looked normal. The mass was biopsied and came back as a grade 2 invasive ductal cancer that was ER and PR positive and HER2 negative with a Ki-67 of 10%. She does have some issues with depression and anxiety but does not smoke.  Review of Systems: A complete review of systems was obtained from the patient. I have reviewed this information and discussed as appropriate with the patient. See HPI as well for other ROS.  ROS   Medical History: Past Medical History:  Diagnosis Date   Anxiety   GERD (gastroesophageal reflux disease)   Patient Active Problem List  Diagnosis   Gastroesophageal reflux disease without esophagitis   Malignant neoplasm of upper-outer quadrant of right breast in female, estrogen receptor positive (CMS-HCC)   Past Surgical History:  Procedure Laterality Date   laparoscopic nissen fundoplication N/A  7253    Allergies  Allergen Reactions   Hydrocodone-Acetaminophen Itching  Other reaction(s): ITCHING   Current Outpatient Medications on File Prior to Visit  Medication Sig Dispense Refill   FLUoxetine (PROZAC) 10 MG capsule Take 1 cap twice a day   hydrOXYzine (ATARAX) 25 MG tablet Take 1 tab tid for anxiety (dose changed)   omeprazole (PRILOSEC) 40 MG DR capsule Take 40 mg by mouth 2 (two) times  daily   ALPRAZolam (XANAX) 1 MG tablet   No current facility-administered medications on file prior to visit.   Family History  Problem Relation Age of Onset   Obesity Mother   High blood pressure (Hypertension) Mother   Hyperlipidemia (Elevated cholesterol) Mother    Social History   Tobacco Use  Smoking Status Never Smoker  Smokeless Tobacco Never Used    Social History   Socioeconomic History   Marital status: Unknown  Tobacco Use   Smoking status: Never Smoker   Smokeless tobacco: Never Used  Scientific laboratory technician Use: Never used  Substance and Sexual Activity   Alcohol use: Never   Drug use: Defer   Sexual activity: Defer   Objective:  There were no vitals filed for this visit.  There is no height or weight on file to calculate BMI.  Physical Exam Vitals reviewed.  Constitutional:  General: She is not in acute distress. Appearance: Normal appearance.  HENT:  Head: Normocephalic and atraumatic.  Right Ear: External ear normal.  Left Ear: External ear normal.  Nose: Nose normal.  Mouth/Throat:  Mouth: Mucous membranes are moist.  Pharynx: Oropharynx is clear.  Eyes:  General: No scleral icterus. Extraocular Movements: Extraocular movements intact.  Conjunctiva/sclera: Conjunctivae normal.  Pupils: Pupils are equal, round, and reactive to light.  Cardiovascular:  Rate and Rhythm: Normal rate and regular rhythm.  Pulses: Normal pulses.  Heart sounds: Normal heart sounds.  Pulmonary:  Effort: Pulmonary effort is normal. No  respiratory distress.  Breath sounds: Normal breath sounds.  Abdominal:  General: Bowel sounds are normal.  Palpations: Abdomen is soft.  Tenderness: There is no abdominal tenderness.  Musculoskeletal:  General: No swelling, tenderness or deformity. Normal range of motion.  Cervical back: Normal range of motion and neck supple.  Skin: General: Skin is warm and dry.  Coloration: Skin is not jaundiced.  Neurological:  General: No  focal deficit present.  Mental Status: She is alert and oriented to person, place, and time.  Psychiatric:  Mood and Affect: Mood normal.  Behavior: Behavior normal.     Breast: There is no palpable mass in either breast. There is no palpable axillary, supraclavicular, or cervical lymphadenopathy.  Labs, Imaging and Diagnostic Testing:  Assessment and Plan:  Diagnoses and all orders for this visit:  Malignant neoplasm of upper-outer quadrant of right breast in female, estrogen receptor positive (CMS-HCC) - Ambulatory Referral to Oncology-Medical - Ambulatory Referral to Radiation Oncology - Ambulatory Referral to Physical Therapy - [facility] Case Posting Request; Future    The patient appears to have a 1 cm cancer in the upper portion of the right breast with clinically negative nodes. I have discussed with her in detail the different options for treatment and at this point she favors bilateral mastectomy. She is not interested in reconstruction. I have discussed with her in detail the risks and benefits of the operation as well as some of the technical aspects and she understands and wishes to proceed. She will meet with medical and radiation oncology as well to discuss adjuvant therapy. She will also meet with physical therapy for preoperative lymphedema testing

## 2021-10-02 NOTE — Anesthesia Preprocedure Evaluation (Signed)
Anesthesia Evaluation  Patient identified by MRN, date of birth, ID band Patient awake    Reviewed: Allergy & Precautions, NPO status , Patient's Chart, lab work & pertinent test results  Airway Mallampati: II  TM Distance: >3 FB Neck ROM: Full    Dental no notable dental hx.    Pulmonary neg pulmonary ROS,    Pulmonary exam normal breath sounds clear to auscultation       Cardiovascular hypertension, Pt. on medications negative cardio ROS Normal cardiovascular exam Rhythm:Regular Rate:Normal     Neuro/Psych  Headaches, Anxiety Depression negative psych ROS   GI/Hepatic Neg liver ROS, hiatal hernia, GERD  Medicated and Controlled,  Endo/Other  negative endocrine ROS  Renal/GU negative Renal ROS  negative genitourinary   Musculoskeletal negative musculoskeletal ROS (+)   Abdominal   Peds negative pediatric ROS (+)  Hematology negative hematology ROS (+)   Anesthesia Other Findings Breast Cancer  Reproductive/Obstetrics negative OB ROS                             Anesthesia Physical  Anesthesia Plan  ASA: 3  Anesthesia Plan: General   Post-op Pain Management: Regional block   Induction: Intravenous  PONV Risk Score and Plan: 3 and Ondansetron, Dexamethasone, Midazolam and Treatment may vary due to age or medical condition  Airway Management Planned: LMA and Oral ETT  Additional Equipment:   Intra-op Plan:   Post-operative Plan: Extubation in OR  Informed Consent: I have reviewed the patients History and Physical, chart, labs and discussed the procedure including the risks, benefits and alternatives for the proposed anesthesia with the patient or authorized representative who has indicated his/her understanding and acceptance.     Dental advisory given  Plan Discussed with: CRNA  Anesthesia Plan Comments:         Anesthesia Quick Evaluation

## 2021-10-02 NOTE — Anesthesia Procedure Notes (Signed)
Procedure Name: LMA Insertion Date/Time: 10/02/2021 10:08 AM Performed by: Tawni Millers, CRNA Pre-anesthesia Checklist: Patient identified, Emergency Drugs available, Suction available and Patient being monitored Patient Re-evaluated:Patient Re-evaluated prior to induction Oxygen Delivery Method: Circle system utilized Preoxygenation: Pre-oxygenation with 100% oxygen Induction Type: IV induction Ventilation: Mask ventilation without difficulty LMA: LMA inserted LMA Size: 4.0 Number of attempts: 1 Airway Equipment and Method: Bite block Placement Confirmation: positive ETCO2 Tube secured with: Tape Dental Injury: Teeth and Oropharynx as per pre-operative assessment

## 2021-10-02 NOTE — Transfer of Care (Signed)
Immediate Anesthesia Transfer of Care Note  Patient: April Houston  Procedure(s) Performed: RIGHT MASTECTOMY WITH SENTINEL LYMPH NODE BIOPSY (Right: Breast) LEFT TOTAL MASTECTOMY (Left: Breast)  Patient Location: PACU  Anesthesia Type:GA combined with regional for post-op pain  Level of Consciousness: sedated  Airway & Oxygen Therapy: Patient Spontanous Breathing and Patient connected to face mask oxygen  Post-op Assessment: Report given to RN and Post -op Vital signs reviewed and stable  Post vital signs: Reviewed and stable  Last Vitals:  Vitals Value Taken Time  BP 139/74 10/02/21 1211  Temp    Pulse 76 10/02/21 1212  Resp 18 10/02/21 1212  SpO2 99 % 10/02/21 1212  Vitals shown include unvalidated device data.  Last Pain:  Vitals:   10/02/21 0847  TempSrc: Oral  PainSc: 0-No pain         Complications: No notable events documented.

## 2021-10-02 NOTE — Interval H&P Note (Signed)
History and Physical Interval Note:  10/02/2021 9:37 AM  April Houston  has presented today for surgery, with the diagnosis of RIGHT BREAST CANCER.  The various methods of treatment have been discussed with the patient and family. After consideration of risks, benefits and other options for treatment, the patient has consented to  Procedure(s): RIGHT MASTECTOMY WITH SENTINEL LYMPH NODE BIOPSY (Right) LEFT TOTAL MASTECTOMY (Left) as a surgical intervention.  The patient's history has been reviewed, patient examined, no change in status, stable for surgery.  I have reviewed the patient's chart and labs.  Questions were answered to the patient's satisfaction.     [name]

## 2021-10-02 NOTE — Progress Notes (Signed)
Assisted Dr. Sabra Heck with right, left, ultrasound guided, pectoralis block. Side rails up, monitors on throughout procedure. See vital signs in flow sheet. Tolerated Procedure well.

## 2021-10-02 NOTE — Anesthesia Procedure Notes (Signed)
Anesthesia Regional Block: Pectoralis block   Pre-Anesthetic Checklist: , timeout performed,  Correct Patient, Correct Site, Correct Laterality,  Correct Procedure, Correct Position, site marked,  Risks and benefits discussed,  Surgical consent,  Pre-op evaluation,  At surgeon's request and post-op pain management  Laterality: Left and Right  Prep: chloraprep       Needles:  Injection technique: Single-shot  Needle Type: Stimiplex     Needle Length: 9cm  Needle Gauge: 21     Additional Needles:   Procedures:,,,, ultrasound used (permanent image in chart),,    Narrative:  Start time: 10/02/2021 9:31 AM End time: 10/02/2021 9:36 AM Injection made incrementally with aspirations every 5 mL.  Performed by: Personally  Anesthesiologist: Lynda Rainwater, MD

## 2021-10-02 NOTE — Op Note (Signed)
10/02/2021  12:03 PM  PATIENT:  April Houston  75 y.o. female  PRE-OPERATIVE DIAGNOSIS:  RIGHT BREAST CANCER  POST-OPERATIVE DIAGNOSIS:  RIGHT BREAST CANCER  PROCEDURE:  Procedure(s): RIGHT MASTECTOMY WITH DEEP RIGHT AXILLARY SENTINEL LYMPH NODE BIOPSY (Right) LEFT SIMPLE MASTECTOMY (Left)  SURGEON:  Surgeon(s) and Role:    * Jovita Kussmaul, MD - Primary  PHYSICIAN ASSISTANT:   ASSISTANTS: none   ANESTHESIA:   general  EBL:  25 mL   BLOOD ADMINISTERED:none  DRAINS: (2) Blake drain(s) in the prepectoral space    LOCAL MEDICATIONS USED:  NONE  SPECIMEN:  Source of Specimen:  right mastectomy with sentinel node, left mastectomy  DISPOSITION OF SPECIMEN:  PATHOLOGY  COUNTS:  YES  TOURNIQUET:  * No tourniquets in log *  DICTATION: .Dragon Dictation  After informed consent was obtained the patient was brought to the operating room and placed in the supine position on the operating table.  After adequate induction of general anesthesia the patient's bilateral chest, breast, and axillary areas were prepped with ChloraPrep, allowed to dry, and draped in usual sterile manner.  An appropriate timeout was performed.  2 cc of iron oxide were then injected in the subareolar plexus on the right.  Attention was first turned to the left breast.  An elliptical incision was made around the nipple and areola complex in order to minimize the excess skin.  The incision was carried through the skin and subcutaneous tissue sharply with the PlasmaBlade.  Breast hooks were used to elevate the skin flaps anteriorly towards the ceiling.  Thin skin flaps were then created by dissecting between the breast tissue and the subcutaneous fat and skin.  This dissection was carried all the way to the chest wall.  Next the breast was removed from the pectoralis muscle with the pectoralis fascia.  Once this dissection was complete then the entire left breast was removed from the patient.  It was marked with a  stitch on the lateral skin and sent to pathology for further evaluation.  Hemostasis was achieved using the PlasmaBlade.  The wound was irrigated with saline.  A small stab incision was made near the anterior axillary line inferior to the operative bed.  A tonsil clamp was placed through this opening and used to bring a 19 [place] round Blake drain into the operative bed.  The drain was curled along the chest wall.  The drain was anchored to the skin with a 2-0 nylon stitch.  Next the superior and inferior skin flaps were grossly reapproximated with interrupted 3-0 Vicryl stitches.  The skin was then closed with a running 4-0 Monocryl subcuticular stitch.  The drain was placed to bulb suction and there was a good seal.  Attention was then turned to the right breast.  A similar elliptical incision was made with a 10 blade knife.  The incision was carried through the skin and subcutaneous tissue sharply with the PlasmaBlade.  Breast hooks were used to elevate the skin flaps anteriorly towards the ceiling.  Thin skin flaps were then created by dissection between the breast tissue and the subcutaneous fat and skin.  This dissection was carried all the way to the chest wall circumferentially.  Next the breast was removed from the pectoralis muscle with the pectoralis fashion.  Once the dissection reached the right axilla I then used the mag trace to identify a lymph node with a signal.  This lymph node was excised sharply with the PlasmaBlade and the surrounding  small vessels and lymphatics were controlled with clips.  No other hot or palpable nodes were identified in the right axilla.  The rest of the breast was then removed from the chest wall and the right breast was removed from the patient.  The breast was marked with a stitch on the lateral skin and sent to pathology for further evaluation.  Hemostasis was achieved using the PlasmaBlade.  The wound was irrigated with copious amounts of saline.  A small stab incision  was made near the anterior axillary line inferior to the operative bed.  A tonsil clamp was placed through this opening and used to bring a 19 [place] round Blake drain into the operative bed.  The drain was curled along the chest wall.  The drain was anchored to the skin with a 2-0 nylon stitch.  Next the superior and inferior skin flaps were grossly reapproximated with interrupted 3-0 Vicryl stitches.  The skin was then closed with a running 4-0 Monocryl subcuticular stitch.  Dermabond dressings were applied.  The drains were placed to bulb suction and there was a good seal.  The skin flaps were healthy.  The patient tolerated the procedure well.  At the end of the case all needle sponge and instrument counts were correct.  The patient was then awakened and taken to recovery in stable condition.  PLAN OF CARE: Admit for overnight observation  PATIENT DISPOSITION:  PACU - hemodynamically stable.   Delay start of Pharmacological VTE agent (>24hrs) due to surgical blood loss or risk of bleeding: no

## 2021-10-03 DIAGNOSIS — C50411 Malignant neoplasm of upper-outer quadrant of right female breast: Secondary | ICD-10-CM | POA: Diagnosis not present

## 2021-10-03 MED ORDER — METHOCARBAMOL 500 MG PO TABS
500.0000 mg | ORAL_TABLET | Freq: Four times a day (QID) | ORAL | 1 refills | Status: DC | PRN
Start: 2021-10-03 — End: 2021-11-06

## 2021-10-03 MED ORDER — HEPARIN SODIUM (PORCINE) 5000 UNIT/ML IJ SOLN
INTRAMUSCULAR | Status: AC
  Filled 2021-10-03: qty 1

## 2021-10-03 MED ORDER — TRAMADOL HCL 50 MG PO TABS: 50.0000 mg | ORAL_TABLET | Freq: Four times a day (QID) | ORAL | 0 refills | Status: DC | PRN

## 2021-10-03 NOTE — Anesthesia Postprocedure Evaluation (Signed)
Anesthesia Post Note  Patient: BRISELDA NAVAL  Procedure(s) Performed: RIGHT MASTECTOMY WITH SENTINEL LYMPH NODE BIOPSY (Right: Breast) LEFT TOTAL MASTECTOMY (Left: Breast)     Patient location during evaluation: PACU Anesthesia Type: General Level of consciousness: awake and alert Pain management: pain level controlled Vital Signs Assessment: post-procedure vital signs reviewed and stable Respiratory status: spontaneous breathing, nonlabored ventilation and respiratory function stable Cardiovascular status: blood pressure returned to baseline and stable Postop Assessment: no apparent nausea or vomiting Anesthetic complications: no   No notable events documented.  Last Vitals:  Vitals:   10/03/21 0500 10/03/21 0600  BP:    Pulse: 84 80  Resp:    Temp:    SpO2: 94% 94%    Last Pain:  Vitals:   10/03/21 0600  TempSrc:   PainSc: Asleep                 Lynda Rainwater

## 2021-10-03 NOTE — Discharge Instructions (Addendum)

## 2021-10-03 NOTE — Progress Notes (Signed)
1 Day Post-Op   Subjective/Chief Complaint: One pain overnight. No complaints today   Objective: Vital signs in last 24 hours: Temp:  [97 F (36.1 C)-98.1 F (36.7 C)] 98.1 F (36.7 C) (12/16 0700) Pulse Rate:  [61-102] 87 (12/16 0730) Resp:  [12-20] 16 (12/16 0700) BP: (106-147)/(69-96) 129/79 (12/16 0700) SpO2:  [92 %-100 %] 99 % (12/16 0730) Weight:  [65.3 kg] 65.3 kg (12/15 0847)    Intake/Output from previous day: 12/15 0701 - 12/16 0700 In: 2820 [P.O.:1720; I.V.:1000; IV Piggyback:100] Out: 9518 [Urine:2900; Drains:230; Blood:25] Intake/Output this shift: No intake/output data recorded.  General appearance: alert and cooperative Resp: clear to auscultation bilaterally Chest wall: no tenderness, skin flaps look good Cardio: regular rate and rhythm GI: soft, non-tender; bowel sounds normal; no masses,  no organomegaly  Lab Results:  No results for input(s): WBC, HGB, HCT, PLT in the last 72 hours. BMET No results for input(s): NA, K, CL, CO2, GLUCOSE, BUN, CREATININE, CALCIUM in the last 72 hours. PT/INR No results for input(s): LABPROT, INR in the last 72 hours. ABG No results for input(s): PHART, HCO3 in the last 72 hours.  Invalid input(s): PCO2, PO2  Studies/Results: No results found.  Anti-infectives: Anti-infectives (From admission, onward)    Start     Dose/Rate Route Frequency Ordered Stop   10/02/21 0830  ceFAZolin (ANCEF) IVPB 2g/100 mL premix        2 g 200 mL/hr over 30 Minutes Intravenous On call to O.R. 10/02/21 0824 10/02/21 1006       Assessment/Plan: s/p Procedure(s): RIGHT MASTECTOMY WITH SENTINEL LYMPH NODE BIOPSY (Right) LEFT TOTAL MASTECTOMY (Left) Advance diet Discharge  LOS: 0 days    [name] 10/03/2021

## 2021-10-06 ENCOUNTER — Encounter (HOSPITAL_BASED_OUTPATIENT_CLINIC_OR_DEPARTMENT_OTHER): Payer: Self-pay | Admitting: General Surgery

## 2021-10-06 ENCOUNTER — Encounter: Payer: Self-pay | Admitting: General Practice

## 2021-10-06 NOTE — Progress Notes (Signed)
[place] [facility] Note  Reached April Houston by phone for post-op check-in. She reports good support and optimism that pain will improve over time. No other concerns at this time; she plans to phone chaplain as needed/desired.   [place], [place], [facility] Pager [phone] Voicemail [phone]

## 2021-10-07 ENCOUNTER — Encounter: Payer: Self-pay | Admitting: *Deleted

## 2021-10-08 LAB — SURGICAL PATHOLOGY

## 2021-10-20 NOTE — Progress Notes (Incomplete)
Patient Care Team: Arlyss Repress, MD as PCP - General (Internal Medicine) Rockwell Germany, RN as Oncology Nurse Navigator Mauro Kaufmann, RN as Oncology Nurse Navigator Jovita Kussmaul, MD as Consulting Physician (General Surgery) Nicholas Lose, MD as Consulting Physician (Hematology and Oncology) Eppie Gibson, MD as Attending Physician (Radiation Oncology)  DIAGNOSIS: No diagnosis found.  SUMMARY OF ONCOLOGIC HISTORY: Oncology History  Malignant neoplasm of upper-outer quadrant of right breast in female, estrogen receptor positive ([place])  07/29/2021 Initial Diagnosis   Screening mammogram: possible asymmetry in the right breast. Diagnostic mammogram and [place]: 1.1 cm irregular mass with spiculated margins and calcifications at 12:00 position. Biopsy: Grade 2 IDC and DCIS, ER+(100%)/PR-HER2 negative, Ki-67 10%   08/06/2021 Cancer Staging   Staging form: Breast, AJCC 8th Edition - Clinical stage from 08/06/2021: Stage IA (cT1b, cN0, cM0, G2, ER+, PR-, HER2-) - Signed by Nicholas Lose, MD on 08/06/2021 Stage prefix: Initial diagnosis Histologic grading system: 3 grade system      CHIEF COMPLIANT: Follow-up of right breast cancer  INTERVAL HISTORY: April Houston is a 76 y.o. with above-mentioned history of right breast cancer. 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 She presents to the clinic today for follow-up.   ALLERGIES:  is allergic to hydrocodone, other, and zantac [ranitidine hcl].  MEDICATIONS:  Current Outpatient Medications  Medication Sig Dispense Refill   diltiazem 2 % GEL Apply 1 application topically 3 (three) times daily. 30 g 1   methocarbamol (ROBAXIN) 500 MG tablet Take 1 tablet (500 mg total) by mouth every 6 (six) hours as needed for muscle spasms. 30 tablet 1   topiramate  (TOPAMAX) 25 MG tablet Take 1 tab q hs x 7d, then 2 q hs (Patient not taking: Reported on 08/26/2021)     traMADol (ULTRAM) 50 MG tablet Take 1 tablet (50 mg total) by mouth every 6 (six) hours as needed (mild pain). 30 tablet 0   Vitamin D, Ergocalciferol, (DRISDOL) 1.25 MG (50000 UNIT) CAPS capsule Take 50,000 Units by mouth once a week.     No current facility-administered medications for this visit.    PHYSICAL EXAMINATION: ECOG PERFORMANCE STATUS: {CHL ONC ECOG PS:[phone]}  There were no vitals filed for this visit. There were no vitals filed for this visit.  BREAST:*** No palpable masses or nodules in either right or left breasts. No palpable axillary supraclavicular or infraclavicular adenopathy no breast tenderness or nipple discharge. (exam performed in the presence of a chaperone)  LABORATORY DATA:  I have reviewed the data as listed CMP Latest Ref Rng & Units 08/06/2021 12/24/2020 11/30/2017  Glucose 70 - 99 mg/dL 76 96 148(H)  BUN 8 - 23 mg/dL '16 18 11  ' Creatinine 0.44 - 1.00 mg/dL 1.32(H) 1.33(H) 1.18  Sodium 135 - 145 mmol/L 142 134(L) 135  Potassium 3.5 - 5.1 mmol/L 3.8 4.0 3.6  Chloride 98 - 111 mmol/L 108 106 101  CO2 22 - 32 mmol/L 24 20(L) 26  Calcium 8.9 - 10.3 mg/dL 9.6 9.1 9.9  Total Protein 6.5 - 8.1 g/dL 6.8 6.3(L) -  Total Bilirubin 0.3 - 1.2 mg/dL 0.4 0.8 -  Alkaline Phos 38 - 126 U/L 56 56 -  AST 15 - 41 U/L 14(L) 17 -  ALT 0 - 44 U/L 14 19 -    Lab Results  Component Value Date   WBC 8.6 08/06/2021   HGB 12.5 08/06/2021   HCT 37.6 08/06/2021   MCV 90.6 08/06/2021   PLT 281 08/06/2021   NEUTROABS 4.3 08/06/2021    ASSESSMENT & PLAN:  No problem-specific Assessment & Plan notes found for this encounter.    No orders of the defined types were placed in this encounter.  The patient has a good understanding of the overall plan. she agrees with it. she will call with any problems that may develop before the next visit here.  Total time spent:  *** mins including face to face time and time spent for planning, charting and coordination of care  Rulon Eisenmenger, MD, MPH 10/20/2021  I, Thana Ates, am acting as scribe for Dr. Nicholas Lose.  {insert scribe attestation}

## 2021-10-21 ENCOUNTER — Telehealth: Payer: Self-pay | Admitting: *Deleted

## 2021-10-21 ENCOUNTER — Encounter: Payer: Self-pay | Admitting: *Deleted

## 2021-10-21 ENCOUNTER — Inpatient Hospital Stay: Payer: 59 | Attending: Hematology and Oncology | Admitting: Hematology and Oncology

## 2021-10-21 DIAGNOSIS — C773 Secondary and unspecified malignant neoplasm of axilla and upper limb lymph nodes: Secondary | ICD-10-CM | POA: Insufficient documentation

## 2021-10-21 DIAGNOSIS — Z9013 Acquired absence of bilateral breasts and nipples: Secondary | ICD-10-CM | POA: Insufficient documentation

## 2021-10-21 DIAGNOSIS — Z170 Estrogen receptor positive status [ER+]: Secondary | ICD-10-CM | POA: Insufficient documentation

## 2021-10-21 DIAGNOSIS — C50411 Malignant neoplasm of upper-outer quadrant of right female breast: Secondary | ICD-10-CM | POA: Insufficient documentation

## 2021-10-21 DIAGNOSIS — Z79899 Other long term (current) drug therapy: Secondary | ICD-10-CM | POA: Insufficient documentation

## 2021-10-21 DIAGNOSIS — Z885 Allergy status to narcotic agent status: Secondary | ICD-10-CM | POA: Insufficient documentation

## 2021-10-21 NOTE — Assessment & Plan Note (Deleted)
10/02/2021:Bilateral mastectomies Left mastectomy: Benign Right mastectomy: Grade 2 IDC 1.2 cm with high-grade DCIS, margins negative, 1/2 sentinel lymph node positive, ER 100%, PR 0%, HER2 negative, Ki-67 10%  Pathology counseling: I discussed the final pathology report of the patient provided  a copy of this report. I discussed the margins.  We also discussed the final staging along with previously performed ER/PR testing.  Treatment plan: 1.  Patient did not want to do chemotherapy and therefore we decided not to perform MammaPrint testing 2. patient also does not want to do radiation. 3.  Adjuvant antiestrogen therapy with letrozole 2.5 mg daily x7 years  Letrozole counseling: We discussed the risks and benefits of anti-estrogen therapy with aromatase inhibitors. These include but not limited to insomnia, hot flashes, mood changes, vaginal dryness, bone density loss, and weight gain. We strongly believe that the benefits far outweigh the risks. Patient understands these risks and consented to starting treatment. Planned treatment duration is 7 years.  Return to clinic in 3 months for survivorship care plan visit

## 2021-10-21 NOTE — Telephone Encounter (Signed)
Spoke with patient regarding her missed appointment from today 1/3. She states she did write the appt down and wasn't sure when it was. She did see Dr. Marlou Starks today. She she doesn't feel well. Offered to to reschedule appt with Dr. Lindi Adie for next week, but states "I just can't think about that right now and will call you back when I am feeling better." Contact information given to call me back.

## 2021-10-23 ENCOUNTER — Ambulatory Visit: Payer: 59 | Attending: General Surgery | Admitting: Rehabilitation

## 2021-10-23 DIAGNOSIS — R293 Abnormal posture: Secondary | ICD-10-CM | POA: Insufficient documentation

## 2021-10-23 DIAGNOSIS — Z170 Estrogen receptor positive status [ER+]: Secondary | ICD-10-CM | POA: Insufficient documentation

## 2021-10-23 DIAGNOSIS — C50411 Malignant neoplasm of upper-outer quadrant of right female breast: Secondary | ICD-10-CM | POA: Insufficient documentation

## 2021-10-28 ENCOUNTER — Encounter: Payer: Self-pay | Admitting: *Deleted

## 2021-10-28 ENCOUNTER — Telehealth: Payer: Self-pay | Admitting: *Deleted

## 2021-10-28 NOTE — Telephone Encounter (Signed)
Called to follow up with patient regarding scheduling appt with Dr. Lindi Adie post op. She states she will go ahead and get an appt scheduled. Confirmed appt for 1/19 at 230.

## 2021-11-05 NOTE — Progress Notes (Signed)
Just a CBC  Patient Care Team: Arlyss Repress, MD as PCP - General (Internal Medicine) Rockwell Germany, RN as Oncology Nurse Navigator Mauro Kaufmann, RN as Oncology Nurse Navigator Jovita Kussmaul, MD as Consulting Physician (General Surgery) Nicholas Lose, MD as Consulting Physician (Hematology and Oncology) Eppie Gibson, MD as Attending Physician (Radiation Oncology)  DIAGNOSIS:    ICD-10-CM   1. Malignant neoplasm of upper-outer quadrant of right breast in female, estrogen receptor positive ([place])  C50.411    Z17.0       SUMMARY OF ONCOLOGIC HISTORY: Oncology History  Malignant neoplasm of upper-outer quadrant of right breast in female, estrogen receptor positive ([place])  07/29/2021 Initial Diagnosis   Screening mammogram: possible asymmetry in the right breast. Diagnostic mammogram and [place]: 1.1 cm irregular mass with spiculated margins and calcifications at 12:00 position. Biopsy: Grade 2 IDC and DCIS, ER+(100%)/PR-HER2 negative, Ki-67 10%   08/06/2021 Cancer Staging   Staging form: Breast, AJCC 8th Edition - Clinical stage from 08/06/2021: Stage IA (cT1b, cN0, cM0, G2, ER+, PR-, HER2-) - Signed by Nicholas Lose, MD on 08/06/2021 Stage prefix: Initial diagnosis Histologic grading system: 3 grade system    10/02/2021 Surgery   Bilateral mastectomies Left mastectomy: Benign Right mastectomy: Grade 2 IDC 1.2 cm with high-grade DCIS, margins negative, 1/2 sentinel lymph node positive, ER 100%, PR 0%, HER2 negative, Ki-67 10%     CHIEF COMPLIANT: Follow-up of right breast cancer  INTERVAL HISTORY: April Houston is a 76 y.o. with above-mentioned history of right breast cancer. 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 She presents to the clinic today for follow-up.   ALLERGIES:  is allergic to hydrocodone,  other, and zantac [ranitidine hcl].  MEDICATIONS:  Current Outpatient Medications  Medication Sig Dispense Refill   diltiazem 2 % GEL Apply 1 application topically 3 (three) times daily. 30 g 1   methocarbamol (ROBAXIN) 500 MG tablet Take 1 tablet (500 mg total) by mouth every 6 (six) hours as needed for muscle spasms. 30 tablet 1   topiramate (TOPAMAX) 25 MG tablet Take 1 tab q hs x 7d, then 2 q hs (Patient not taking: Reported on 08/26/2021)     traMADol (ULTRAM) 50 MG tablet Take 1 tablet (50 mg total) by mouth every 6 (six) hours as needed (mild pain). 30 tablet 0   Vitamin D, Ergocalciferol, (DRISDOL) 1.25 MG (50000 UNIT) CAPS capsule Take 50,000 Units by mouth once a week.     No current facility-administered medications for this visit.    PHYSICAL EXAMINATION: ECOG PERFORMANCE STATUS: 1 - Symptomatic but completely ambulatory  Vitals:   11/06/21 1429  BP: 139/84  Pulse: 90  Resp: 17  Temp: (!) 97.3 F (36.3 C)  SpO2: 100%   Filed Weights   11/06/21 1429  Weight: 142 lb 6.4 oz (64.6 kg)    BREAST: No palpable masses or nodules in either right or left breasts. No palpable axillary supraclavicular or infraclavicular adenopathy no breast tenderness or nipple discharge. (exam performed in the presence of a chaperone)  LABORATORY DATA:  I have reviewed the data as listed CMP Latest Ref Rng & Units 08/06/2021 12/24/2020 11/30/2017  Glucose 70 - 99 mg/dL 76 96 148(H)  BUN 8 - 23 mg/dL '16 18 11  ' Creatinine 0.44 - 1.00 mg/dL 1.32(H) 1.33(H) 1.18  Sodium 135 - 145  mmol/L 142 134(L) 135  Potassium 3.5 - 5.1 mmol/L 3.8 4.0 3.6  Chloride 98 - 111 mmol/L 108 106 101  CO2 22 - 32 mmol/L 24 20(L) 26  Calcium 8.9 - 10.3 mg/dL 9.6 9.1 9.9  Total Protein 6.5 - 8.1 g/dL 6.8 6.3(L) -  Total Bilirubin 0.3 - 1.2 mg/dL 0.4 0.8 -  Alkaline Phos 38 - 126 U/L 56 56 -  AST 15 - 41 U/L 14(L) 17 -  ALT 0 - 44 U/L 14 19 -    Lab Results  Component Value Date   WBC 8.6 08/06/2021   HGB 12.5  08/06/2021   HCT 37.6 08/06/2021   MCV 90.6 08/06/2021   PLT 281 08/06/2021   NEUTROABS 4.3 08/06/2021    ASSESSMENT & PLAN:  Malignant neoplasm of upper-outer quadrant of right breast in female, estrogen receptor positive ([place]) 10/02/2021:Bilateral mastectomies Left mastectomy: Benign Right mastectomy: Grade 2 IDC 1.2 cm with high-grade DCIS, margins negative, 1/2 sentinel lymph node positive, ER 100%, PR 0%, HER2 negative, Ki-67 10%  Pathology counseling: I discussed the final pathology report of the patient provided  a copy of this report. I discussed the margins as well as lymph node surgeries. We also discussed the final staging along with previously performed ER/PR and HER-2/neu testing.  Treatment plan:  1.  Radiation consult: Patient is reluctant to consider radiation but is willing to attend the consultation. 2. Adjuvant antiestrogen therapy with tamoxifen x5 years (patient wants to use estrogen vaginal suppositories for vaginal dryness and for sexual intercourse comfort: It is reasonable to use those in my opinion) She has an interesting arrangement with her significant other and they have sexual intercourse twice a week.  She does not want any of this to interfere with her personal life.  Patient is not interested in chemotherapy or even antiestrogen therapy. However I will counsel her after she completes radiation about the pros and cons of tamoxifen.  Return to clinic after radiation for follow-up to discuss antiestrogen therapy   No orders of the defined types were placed in this encounter.  The patient has a good understanding of the overall plan. she agrees with it. she will call with any problems that may develop before the next visit here.  Total time spent: 20 mins including face to face time and time spent for planning, charting and coordination of care  Rulon Eisenmenger, MD, MPH 11/06/2021  I, Thana Ates, am acting as scribe for Dr. Nicholas Lose.  I have  reviewed the above documentation for accuracy and completeness, and I agree with the above.

## 2021-11-06 ENCOUNTER — Inpatient Hospital Stay: Payer: 59 | Admitting: Hematology and Oncology

## 2021-11-06 ENCOUNTER — Encounter: Payer: Self-pay | Admitting: *Deleted

## 2021-11-06 ENCOUNTER — Other Ambulatory Visit: Payer: Self-pay

## 2021-11-06 DIAGNOSIS — Z9013 Acquired absence of bilateral breasts and nipples: Secondary | ICD-10-CM | POA: Diagnosis not present

## 2021-11-06 DIAGNOSIS — C50411 Malignant neoplasm of upper-outer quadrant of right female breast: Secondary | ICD-10-CM | POA: Diagnosis present

## 2021-11-06 DIAGNOSIS — Z170 Estrogen receptor positive status [ER+]: Secondary | ICD-10-CM

## 2021-11-06 DIAGNOSIS — Z885 Allergy status to narcotic agent status: Secondary | ICD-10-CM | POA: Diagnosis not present

## 2021-11-06 DIAGNOSIS — C773 Secondary and unspecified malignant neoplasm of axilla and upper limb lymph nodes: Secondary | ICD-10-CM | POA: Diagnosis not present

## 2021-11-06 DIAGNOSIS — Z79899 Other long term (current) drug therapy: Secondary | ICD-10-CM | POA: Diagnosis not present

## 2021-11-06 MED ORDER — OMEPRAZOLE 40 MG PO CPDR: 40.0000 mg | DELAYED_RELEASE_CAPSULE | Freq: Two times a day (BID) | ORAL | Status: AC

## 2021-11-06 MED ORDER — FLUOXETINE HCL 10 MG PO CAPS
10.0000 mg | ORAL_CAPSULE | Freq: Three times a day (TID) | ORAL | 3 refills | Status: AC
Start: 2021-11-06 — End: ?

## 2021-11-06 MED ORDER — OXYCODONE-ACETAMINOPHEN 5-325 MG PO TABS: 1.0000 | ORAL_TABLET | Freq: Three times a day (TID) | ORAL | 0 refills | Status: DC | PRN

## 2021-11-06 MED ORDER — ALPRAZOLAM 1 MG PO TABS: 1.0000 mg | ORAL_TABLET | Freq: Three times a day (TID) | ORAL | 0 refills | Status: AC

## 2021-11-06 NOTE — Assessment & Plan Note (Signed)
10/02/2021:Bilateral mastectomies Left mastectomy: Benign Right mastectomy: Grade 2 IDC 1.2 cm with high-grade DCIS, margins negative, 1/2 sentinel lymph node positive, ER 100%, PR 0%, HER2 negative, Ki-67 10%  Pathology counseling: I discussed the final pathology report of the patient provided  a copy of this report. I discussed the margins as well as lymph node surgeries. We also discussed the final staging along with previously performed ER/PR and HER-2/neu testing.  Treatment plan:  1.  Patient is not interested in chemotherapy and therefore Oncotype DX was not sent. 2. Adjuvant antiestrogen therapy with letrozole 2.5 mg daily x5 years  Letrozole counseling: We discussed the risks and benefits of anti-estrogen therapy with aromatase inhibitors. These include but not limited to insomnia, hot flashes, mood changes, vaginal dryness, bone density loss, and weight gain. We strongly believe that the benefits far outweigh the risks. Patient understands these risks and consented to starting treatment. Planned treatment duration is 5 years.  Return to clinic in 3 months for survivorship care plan visit

## 2021-11-07 ENCOUNTER — Ambulatory Visit: Payer: 59 | Attending: Hematology and Oncology

## 2021-11-07 DIAGNOSIS — Z170 Estrogen receptor positive status [ER+]: Secondary | ICD-10-CM | POA: Insufficient documentation

## 2021-11-07 DIAGNOSIS — R600 Localized edema: Secondary | ICD-10-CM | POA: Insufficient documentation

## 2021-11-07 DIAGNOSIS — R293 Abnormal posture: Secondary | ICD-10-CM | POA: Insufficient documentation

## 2021-11-07 DIAGNOSIS — M25612 Stiffness of left shoulder, not elsewhere classified: Secondary | ICD-10-CM | POA: Insufficient documentation

## 2021-11-07 DIAGNOSIS — M25611 Stiffness of right shoulder, not elsewhere classified: Secondary | ICD-10-CM | POA: Insufficient documentation

## 2021-11-07 DIAGNOSIS — C50411 Malignant neoplasm of upper-outer quadrant of right female breast: Secondary | ICD-10-CM | POA: Insufficient documentation

## 2021-11-07 NOTE — Therapy (Addendum)
[facility] @ [place], [place], [address] Phone: [phone]   Fax:  [phone]  Physical Therapy Treatment  Patient Details  Name: April Houston MRN: [id number] Date of Birth: Apr 16, 1946 Referring Provider (PT): Dr. Autumn Messing   Encounter Date: 11/07/2021   PT End of Session - 11/07/21 1248     Visit Number 1    Number of Visits 12    Date for PT Re-Evaluation 12/19/21    PT Start Time 0800    PT Stop Time 0853    PT Time Calculation (min) 53 min    Activity Tolerance Treatment limited secondary to agitation;Patient tolerated treatment well    Behavior During Therapy [facility] for tasks assessed/performed             Past Medical History:  Diagnosis Date   Anal fissure    Anal pain    Anemia    Anxiety    Blood transfusion without reported diagnosis 2002   After Nissen Fundiplication   Bradycardia    Breast cancer ([place])    Cataract    Chronic headaches    Depression    Endometriosis    GERD (gastroesophageal reflux disease)    Hiatal hernia    Hypertension    no meds/ in past; pt states resolved   IBS (irritable bowel syndrome)    Lichen sclerosus    Pancreatitis    Panic attacks    Peptic ulcer    PVC (premature ventricular contraction)    SOB (shortness of breath)    with panic attack and anxiety   Wears glasses     Past Surgical History:  Procedure Laterality Date   CATARACT EXTRACTION, BILATERAL     Bil/ In 2018   LAPAROSCOPIC NISSEN FUNDOPLICATION  6712   and Repair Hiatal Hernia   LIPOMA EXCISION     MASTECTOMY W/ SENTINEL NODE BIOPSY Right 10/02/2021   Procedure: RIGHT MASTECTOMY WITH SENTINEL LYMPH NODE BIOPSY;  Surgeon: Jovita Kussmaul, MD;  Location: [place] [facility];  Service: General;  Laterality: Right;   RECTAL EXAM UNDER ANESTHESIA N/A 12/06/2015   Procedure: ANAL  EXAM UNDER ANESTHESIA, BIOPSY;  Surgeon: Leighton Ruff, MD;  Location: [place];   Service: General;  Laterality: N/A;   TONSILLECTOMY  age 37   TOTAL ABDOMINAL HYSTERECTOMY W/ BILATERAL SALPINGOOPHORECTOMY  1992   TOTAL MASTECTOMY Left 10/02/2021   Procedure: LEFT TOTAL MASTECTOMY;  Surgeon: Jovita Kussmaul, MD;  Location: [facility];  Service: General;  Laterality: Left;    There were no vitals filed for this visit.   Subjective Assessment - 11/07/21 0756     Subjective Pt is s/p Right mastectomy with deep axillary SLNB on 10/02/2021 and a left simple mastectomy.I have alot of fluid trapped in the cavities where I had breasts. The surgeon tried to drain the fluid, but he couldn't. I am not sleeping well. I am supposed to start back to work next week full time, but I have alot of vacation and sick time. I havent been doing exercises, but I have been using my arms gently and doing household things. I feel really tight through my chest.    Pertinent History Patient was diagnosed on 06/26/2021 with right grade II invasive ductal carcinoma breast cancer. It measures 1 cm and is located in the upper outer quadrant. It is ER positive, PR negative, and HER2 negative with a Ki67 of 10%. She had a  right mastectomy with deep axilary SLNB and a left simple mastectomy on 10/02/2021. she is presently refusing chemo and is not sure if she wants to do radiation.    Patient Stated Goals Post surgical followup reassessment    Currently in Pain? Yes    Pain Score 6    with reaching   Pain Location Axilla    Pain Orientation Left;Right    Pain Descriptors / Indicators Tightness;Sharp    Pain Type Acute pain    Pain Onset More than a month ago    Pain Frequency Constant    Aggravating Factors  reaching, sleeping    Pain Relieving Factors was given percocet yesterday and that seems to have helped    Effect of Pain on Daily Activities limits reaching                [facility] PT Assessment - 11/07/21 0001       Assessment   Medical Diagnosis Right breast cancer    Referring  Provider (PT) Dr. Autumn Messing    Onset Date/Surgical Date 10/02/21    Prior Therapy baseline      Precautions   Precaution Comments lymphedema risk      [place] residence    Living Arrangements Other relatives   sister     Cognition   Memory Impaired   per pt     Observation/Other Assessments   Observations fluid swelling bilateral chest easily moved and rippling. incisions healed. Cording right axillary region to medial upper arm    Skin Integrity incisions healed;old scar right chest from [place] (an autoimmune disorder      Posture/Postural Control   Posture/Postural Control Postural limitations    Postural Limitations Rounded Shoulders;Forward head;Increased thoracic kyphosis      AROM   Overall AROM Comments Cervical AROM is WNL    Right/Left Shoulder Right;Left    Right Shoulder Extension 45 Degrees    Right Shoulder Flexion 116 Degrees    Right Shoulder ABduction 125 Degrees    Right Shoulder Internal Rotation 60 Degrees    Right Shoulder External Rotation 90 Degrees    Left Shoulder Extension 50 Degrees    Left Shoulder Flexion 148 Degrees    Left Shoulder ABduction 135 Degrees    Left Shoulder Internal Rotation 60 Degrees    Left Shoulder External Rotation 78 Degrees      Palpation   Palpation comment very tender axillary border of pectoralis, lats               LYMPHEDEMA/ONCOLOGY QUESTIONNAIRE - 11/07/21 0001       Type   Cancer Type Right breast cancer      What other symptoms do you have   Are you Having Heaviness or Tightness Yes    Are you having Pain Yes    Are you having pitting edema No    Is it Hard or Difficult finding clothes that fit No    Do you have infections No    Is there Decreased scar mobility Yes      Right Upper Extremity Lymphedema   10 cm Proximal to Olecranon Process 25.7 cm    Olecranon Process 23.1 cm    10 cm Proximal to Ulnar Styloid Process 18.1 cm    Just Proximal to Ulnar  Styloid Process 14.1 cm    At Base of 2nd Digit 6 cm      Left Upper Extremity Lymphedema   10 cm Proximal  to Olecranon Process 26.2 cm    Olecranon Process 22.8 cm    10 cm Proximal to Ulnar Styloid Process 17.9 cm    Just Proximal to Ulnar Styloid Process 14.1 cm    At Base of 2nd Digit 6 cm                Quick Dash - 11/07/21 0001     Open a tight or new jar Unable    Do heavy household chores (wash walls, wash floors) Mild difficulty    Carry a shopping bag or briefcase Mild difficulty    Wash your back Mild difficulty    Use a knife to cut food Mild difficulty    Recreational activities in which you take some force or impact through your arm, shoulder, or hand (golf, hammering, tennis) Unable    During the past week, to what extent has your arm, shoulder or hand problem interfered with your normal social activities with family, friends, neighbors, or groups? Modererately    During the past week, to what extent has your arm, shoulder or hand problem limited your work or other regular daily activities Extremely    Arm, shoulder, or hand pain. Extreme    Tingling (pins and needles) in your arm, shoulder, or hand None    Difficulty Sleeping So much difficuSo much difficulty, I can't sleep    DASH Score 59.09 %                             PT Education - 11/07/21 0858     Education Details 4 post op exercises, compression bra and given script,    Person(s) Educated Patient    Methods Explanation;Handout    Comprehension Returned demonstration                 PT Long Term Goals - 11/07/21 1259       PT LONG TERM GOAL #1   Title Patient will demonstrate she has regained full shoulder ROM and function post operatively compared to baselines.    Time 8    Period Weeks    Status New    Target Date 11/21/21      PT LONG TERM GOAL #2   Title Pt will have decreased pain/tightness by atleast 50%    Time 6    Period Weeks    Status New     Target Date 12/19/21      PT LONG TERM GOAL #3   Title Pts wuick dash will improve to no greater than 18%    Baseline 13%    Time 6    Period Weeks    Target Date 12/19/21      PT LONG TERM GOAL #4   Title Pt will have decreased chest swelling by atleast 50%    Time 6    Period Weeks    Status New    Target Date 12/19/21      PT LONG TERM GOAL #5   Title pt will be independent with self MLD to decrease swelling    Time 6    Period Weeks    Status New    Target Date 12/19/21                   Plan - 11/07/21 1249     Clinical Impression Statement Pt returns for reassessment after surgery with complaints of bilateral chest tightness, swelling and intermittent sharp pain.  She has signifcant fluid in the chest  today more so in the left that is easily displaced and ripples with light touch.  IT was too painful for the surgeon to drain.  She has some limitations in right shoulder ROM greater than left with some cording noted. She is very tender in the pectorals and lats bilaterally and postures with FHP and rounded shoulders.  She was educated in 4 post op exercises today because she did not remember having been shown them initially, but she remembered the handout. She was instructed in gentle scar massage to her incisions, and she was given a script for a compression bra. She has an appt to be fit today but didn't have a script? She will benefit from skilled PT to address deficits and return to PLOF    Personal Factors and Comorbidities Age;Comorbidity 2    Comorbidities Right breast CA, anxiety, Headaches    Stability/Clinical Decision Making Stable/Uncomplicated    Clinical Decision Making Low    Rehab Potential Excellent    PT Frequency 2x / week    PT Duration 6 weeks    PT Treatment/Interventions ADLs/Self Care Home Management;Therapeutic exercise;Manual techniques;Orthotic Fit/Training;Patient/family education;Manual lymph drainage;Scar mobilization;Passive range of  motion;Taping    PT Next Visit Plan STM pecs, Lats, PROM, assess compression bra/chest swelling, MLD    PT Home Exercise Plan Post op shoulder ROM HEP    Consulted and Agree with Plan of Care Patient;Family member/caregiver             Patient will benefit from skilled therapeutic intervention in order to improve the following deficits and impairments:  Postural dysfunction, Increased edema, Pain, Decreased activity tolerance, Impaired UE functional use, Decreased range of motion, Decreased knowledge of precautions  Visit Diagnosis: Malignant neoplasm of upper-outer quadrant of right breast in female, estrogen receptor positive ([place])  Abnormal posture  Localized edema  Stiffness of left shoulder, not elsewhere classified  Stiffness of right shoulder, not elsewhere classified     Problem List Patient Active Problem List   Diagnosis Date Noted   Cancer of right female breast ([place]) 10/02/2021   Malignant neoplasm of upper-outer quadrant of right breast in female, estrogen receptor positive ([place]) 07/31/2021   Heartburn 12/04/2020   Migraines 12/04/2020   Pure hypercholesterolemia 10/01/2020   Laryngopharyngeal reflux (LPR) 09/05/2020   Irritable bowel syndrome with constipation 03/26/2020   Hair loss 10/31/2019   Lipid screening 04/14/2019   Malaise 04/14/2019   Major depressive disorder, recurrent, 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 Patient goals were not met. Patient is being discharged due to not returning since the last visit.  Claris Pong, PT 11/07/2021, 1:07 PM  [facility] @ [place], [place], [address] Phone: [phone]   Fax:  [phone]  Name: VAL FARNAM MRN: [id number] Date of Birth: November 30, 1945

## 2021-11-07 NOTE — Patient Instructions (Signed)
[facility]  [address], [address]  [place] [address]  [phone]  After Breast Cancer Class It is recommended you attend the ABC class to be educated on lymphedema risk reduction. This class is free of charge and lasts for 1 hour. It is a 1-time class. You will need to download the Webex app either on your phone or computer. We will send you a link the night before or the morning of the class. You should be able to click on that link to join the class. This is not a confidential class. You don't have to turn your camera on, but other participants may be able to see your email address.  Scar massage You can begin gentle scar massage to you incision sites. Gently place one hand on the incision and move the skin (without sliding on the skin) in various directions. Do this for a few minutes and then you can gently massage either coconut oil or vitamin E cream into the scars.  Compression garment You should continue wearing your compression bra until you feel like you no longer have swelling.  Home exercise Program Continue doing the exercises you were given until you feel like you can do them without feeling any tightness at the end.   Walking Program Studies show that 30 minutes of walking per day (fast enough to elevate your heart rate) can significantly reduce the risk of a cancer recurrence. If you can't walk due to other medical reasons, we encourage you to find another activity you could do (like a stationary bike or water exercise).  Posture After breast cancer surgery, people frequently sit with rounded shoulders posture because it puts their incisions on slack and feels better. If you sit like this and scar tissue forms in that position, you can become very tight and have pain sitting or standing with good posture. Try to be aware of your posture and sit and stand up tall to heal properly.  Follow up PT: It is recommended you return every 3 months for the  first 3 years following surgery to be assessed on the SOZO machine for an L-Dex score. This helps prevent clinically significant lymphedema in 95% of patients. These follow up screens are 10 minute appointments that you are not billed for. 

## 2021-11-10 ENCOUNTER — Ambulatory Visit: Payer: 59 | Admitting: Physical Therapy

## 2021-11-11 ENCOUNTER — Encounter: Payer: Self-pay | Admitting: *Deleted

## 2021-11-12 ENCOUNTER — Emergency Department (HOSPITAL_COMMUNITY)
Admission: EM | Admit: 2021-11-12 | Discharge: 2021-11-12 | Disposition: A | Discharge status: Home / Self Care | Payer: 59 | Attending: Emergency Medicine | Admitting: Emergency Medicine

## 2021-11-12 ENCOUNTER — Encounter (HOSPITAL_COMMUNITY): Payer: Self-pay | Admitting: Emergency Medicine

## 2021-11-12 ENCOUNTER — Emergency Department (HOSPITAL_COMMUNITY): Payer: 59

## 2021-11-12 DIAGNOSIS — R079 Chest pain, unspecified: Secondary | ICD-10-CM | POA: Diagnosis present

## 2021-11-12 DIAGNOSIS — Z853 Personal history of malignant neoplasm of breast: Secondary | ICD-10-CM | POA: Diagnosis not present

## 2021-11-12 DIAGNOSIS — R0789 Other chest pain: Secondary | ICD-10-CM

## 2021-11-12 DIAGNOSIS — N6489 Other specified disorders of breast: Secondary | ICD-10-CM | POA: Diagnosis not present

## 2021-11-12 LAB — CBC
HCT: 40.1 % (ref 36.0–46.0)
Hemoglobin: 13.6 g/dL (ref 12.0–15.0)
MCH: 29.8 pg (ref 26.0–34.0)
MCHC: 33.9 g/dL (ref 30.0–36.0)
MCV: 87.9 fL (ref 80.0–100.0)
Platelets: 301 10*3/uL (ref 150–400)
RBC: 4.56 MIL/uL (ref 3.87–5.11)
RDW: 13.2 % (ref 11.5–15.5)
WBC: 6.7 10*3/uL (ref 4.0–10.5)
nRBC: 0 % (ref 0.0–0.2)

## 2021-11-12 LAB — BASIC METABOLIC PANEL
Anion gap: 8 (ref 5–15)
BUN: 13 mg/dL (ref 8–23)
CO2: 21 mmol/L — ABNORMAL LOW (ref 22–32)
Calcium: 9.4 mg/dL (ref 8.9–10.3)
Chloride: 106 mmol/L (ref 98–111)
Creatinine, Ser: 1.32 mg/dL — ABNORMAL HIGH (ref 0.44–1.00)
GFR, Estimated: 43 mL/min — ABNORMAL LOW (ref >60)
Glucose, Bld: 110 mg/dL — ABNORMAL HIGH (ref 70–99)
Potassium: 4.4 mmol/L (ref 3.5–5.1)
Sodium: 135 mmol/L (ref 135–145)

## 2021-11-12 LAB — TROPONIN I (HIGH SENSITIVITY): Troponin I (High Sensitivity): <2 ng/L (ref <18)

## 2021-11-12 NOTE — ED Provider Notes (Signed)
[facility] DEPT Provider Note   CSN: [id number] Arrival date & time: 11/12/21  0825     History  Chief Complaint  Patient presents with   Post-op Problem   Chest Pain    TEKESHA Houston is a 76 y.o. female.  Patient presents to ER chief complaint of left-sided chest pain and a sensation of persistent fluid.  She has a history of breast cancer status post mastectomy about 1 month ago.  She had a seroma formation underneath the left mastectomy site and her surgeon attempted to drain it approximately 10 days ago.  She states that this was unsuccessful but since then she has been having some pain at the needle insertion site.  Otherwise denies fevers or cough or vomiting or diarrhea.  She states that she is tried to call the office for follow-up several times but no one is ever called her back.      Home Medications Prior to Admission medications   Medication Sig Start Date End Date Taking? Authorizing Provider  ALPRAZolam Duanne Moron) 1 MG tablet Take 1 tablet (1 mg total) by mouth 3 (three) times daily. 11/06/21   Nicholas Lose, MD  FLUoxetine (PROZAC) 10 MG capsule Take 1 capsule (10 mg total) by mouth in the morning, at noon, and at bedtime. 11/06/21   Nicholas Lose, MD  omeprazole (PRILOSEC) 40 MG capsule Take 1 capsule (40 mg total) by mouth 2 (two) times daily. 11/06/21   Nicholas Lose, MD  oxyCODONE-acetaminophen (PERCOCET/ROXICET) 5-325 MG tablet Take 1 tablet by mouth every 8 (eight) hours as needed for severe pain. 11/06/21   Nicholas Lose, MD  Vitamin D, Ergocalciferol, (DRISDOL) 1.25 MG (50000 UNIT) CAPS capsule Take 50,000 Units by mouth once a week. 05/20/21   [provider]      Allergies    Hydrocodone, Other, and Zantac [ranitidine hcl]    Review of Systems   Review of Systems  Constitutional:  Negative for fever.  HENT:  Negative for ear pain.   Eyes:  Negative for pain.  Respiratory:  Negative for cough.   Cardiovascular:   Positive for chest pain.  Gastrointestinal:  Negative for abdominal pain.  Genitourinary:  Negative for flank pain.  Musculoskeletal:  Negative for back pain.  Skin:  Negative for rash.  Neurological:  Negative for headaches.   Physical Exam Updated Vital Signs BP (!) 146/88    Pulse 72    Resp 12    Ht 5\' 4"  (1.626 m)    Wt 62.6 kg    SpO2 98%    BMI 23.69 kg/m  Physical Exam Constitutional:      General: She is not in acute distress.    Appearance: Normal appearance.  HENT:     Head: Normocephalic.     Nose: Nose normal.  Eyes:     Extraocular Movements: Extraocular movements intact.  Cardiovascular:     Rate and Rhythm: Normal rate.  Pulmonary:     Effort: Pulmonary effort is normal.  Musculoskeletal:        General: Normal range of motion.     Cervical back: Normal range of motion.  Skin:    Comments: Left chest wall incision site appears clean dry and intact.  Needle insertion site visible as well with mild erythema no abnormal warmth appears appropriate.  On palpation there is a fluid wave that appears mild to moderate.  No loculated lesion noted.  No tenderness noted on exam.  Neurological:  General: No focal deficit present.     Mental Status: She is alert. Mental status is at baseline.    ED Results / Procedures / Treatments   Labs (all labs ordered are listed, but only abnormal results are displayed) Labs Reviewed  BASIC METABOLIC PANEL - Abnormal; Notable for the following components:      Result Value   CO2 21 (*)    Glucose, Bld 110 (*)    Creatinine, Ser 1.32 (*)    GFR, Estimated 43 (*)    All other components within normal limits  CBC  TROPONIN I (HIGH SENSITIVITY)    EKG EKG Interpretation  Date/Time:  Wednesday November 12 2021 08:34:26 EST Ventricular Rate:  101 PR Interval:  150 QRS Duration: 80 QT Interval:  353 QTC Calculation: 458 R Axis:   120 Text Interpretation: Sinus tachycardia Right axis deviation Confirmed by Thamas Jaegers  ([id number]) on 11/12/2021 2:40:51 PM  Radiology DG Chest 2 View  Result Date: 11/12/2021 CLINICAL DATA:  A 76 year old female presents for evaluation of LEFT-sided pain after attempted fluid drainage at surgical site following double mastectomy. EXAM: CHEST - 2 VIEW COMPARISON:  December 24, 2020. FINDINGS: Trachea midline. Cardiomediastinal contours and hilar structures are normal. Biapical pleural and parenchymal scarring. No visible pneumothorax, consolidation or sign of pleural effusion. Post bilateral mastectomy. No acute skeletal process on limited evaluation. IMPRESSION: No active cardiopulmonary disease. Electronically Signed   By: Zetta Bills M.D.   On: 11/12/2021 08:58    Procedures Procedures    Medications Ordered in ED Medications - No data to display  ED Course/ Medical Decision Making/ A&P                           Medical Decision Making Amount and/or Complexity of Data Reviewed Labs: ordered. Decision-making details documented in ED Course. Radiology: ordered. Decision-making details documented in ED Course. ECG/medicine tests: ordered and independent interpretation performed. Decision-making details documented in ED Course.  Risk Risk Details: Upon evaluation I do not feel this patient needs admission.  I discussed the case with surgery they arrange for follow-up in her surgeon's office tomorrow.  Patient expressed understanding.  Discharged home stable condition.           Final Clinical Impression(s) / ED Diagnoses Final diagnoses:  Chest wall pain  Seroma of breast    Rx / DC Orders ED Discharge Orders     None         Luna Fuse, MD 11/12/21 [phone]

## 2021-11-12 NOTE — ED Triage Notes (Signed)
Per pt, states she had a double mastectomy on 12/15-states she has been having fluid build up at surgical sites-states her oncologist attempted to drain fluid but might have "hit" something on her left side which is now causing pain

## 2021-11-12 NOTE — ED Provider Triage Note (Signed)
Emergency Medicine Provider Triage Evaluation Note  April Houston , a 76 y.o. female  was evaluated in triage.  Pt complains of fluid on her chest following a double mastectomy on October 02, 2021.  Patient states the fluid building up around her chest is causing pressure pain all over.  She saw her doctor and they attempted to drain the fluid but she has not had any relief.  She is try to make an appointment with her doctor but they are unable to see her and referred her to the ED.  Patient [name] is chest pain that she believes is due to fluid.  Review of Systems  Positive: Chest pain, fluid buildup on chest Negative: Fevers, chills, N/V/  Physical Exam  BP 138/90 (BP Location: Left Arm)    Pulse 99    Resp 12    Ht 5\' 4"  (1.626 m)    Wt 62.6 kg    SpO2 97%    BMI 23.69 kg/m  Gen:   Awake, no distress   Resp:  Normal effort  MSK:   Moves extremities without difficulty  Other:  Bilateral mastectomy sites are healing well with no signs of infection.  There does appear to be some fluid under the skin that wraps around under both arm.  Medical Decision Making  Medically screening exam initiated at 10:09 AM.  Appropriate orders placed.  BAY JARQUIN was informed that the remainder of the evaluation will be completed by another provider, this initial triage assessment does not replace that evaluation, and the importance of remaining in the ED until their evaluation is complete.     April Houston, [place] 11/12/21 1111

## 2021-11-17 ENCOUNTER — Inpatient Hospital Stay
Admission: RE | Admit: 2021-11-17 | Discharge: 2021-11-17 | Disposition: A | Discharge status: Home / Self Care | Payer: Self-pay | Source: Ambulatory Visit | Attending: Radiation Oncology | Admitting: Radiation Oncology

## 2021-11-17 ENCOUNTER — Other Ambulatory Visit: Payer: Self-pay | Admitting: Radiation Oncology

## 2021-11-17 ENCOUNTER — Ambulatory Visit
Admission: RE | Admit: 2021-11-17 | Discharge: 2021-11-17 | Disposition: A | Discharge status: Home / Self Care | Payer: Self-pay | Source: Ambulatory Visit | Attending: Radiation Oncology | Admitting: Radiation Oncology

## 2021-11-17 DIAGNOSIS — C50411 Malignant neoplasm of upper-outer quadrant of right female breast: Secondary | ICD-10-CM

## 2021-11-17 DIAGNOSIS — Z170 Estrogen receptor positive status [ER+]: Secondary | ICD-10-CM

## 2021-11-19 ENCOUNTER — Ambulatory Visit: Admission: RE | Admit: 2021-11-19 | Payer: 59 | Source: Ambulatory Visit

## 2021-11-19 ENCOUNTER — Ambulatory Visit: Payer: 59 | Admitting: Radiation Oncology

## 2021-11-21 ENCOUNTER — Encounter: Payer: 59 | Admitting: Physical Therapy

## 2021-11-27 ENCOUNTER — Encounter: Payer: Self-pay | Admitting: *Deleted

## 2021-11-27 NOTE — Progress Notes (Signed)
76 y.o. G0P0000 Divorced [place] or Caucasian Not Hispanic or Latino female here for annual exam.  She is dating the same guy for the last 2 years. Sexually active, painful, but helped with lubricant.    S/P bilateral mastectomy in 12/15f or a T1CN1A right breast cancer that was ER positive and PR negative and HER2 negative with a Ki-67 of 10%.  She stopped her vaginal estrogen when she was diagnosed with the breast cancer.  h/o morphea and a perianal lesion, dermatologist thought it was a nevus.   H/O vaginal atrophy, dyspareunia and lichen sclerosis.  She uses topical steroids 2-3 x a week.    Struggles with an anal fissure, takes miralax daily (bad if she forgets it). Has terrible pain from the fissures.   No LMP recorded. Patient has had a hysterectomy.          Sexually active: Yes.    The current method of family planning is status post hysterectomy and post menopausal status.    Exercising: Yes.     walking Smoker:  no  Health Maintenance: Pap:  has had hysterectomy  History of abnormal Pap:  no MMG:  2022 see epic  BMD:   06/15/19 low bone mass T-Score -1.3, improved, FRAX 8.8/1.1% Colonoscopy: :2017 polyp - repeat in 10 yrs  TDaP:  2015 Gardasil: n/a   reports that she has never smoked. She has never used smokeless tobacco. She reports that she does not currently use alcohol. She reports that she does not use drugs. She is an Surveyor, minerals, works for the city of [place].  Past Medical History:  Diagnosis Date   Anal fissure    Anal pain    Anemia    Anxiety    Blood transfusion without reported diagnosis 2002   After Nissen Fundiplication   Bradycardia    Breast cancer ([place])    Cataract    Chronic headaches    Depression    Endometriosis    GERD (gastroesophageal reflux disease)    Hiatal hernia    Hypertension    no meds/ in past; pt states resolved   IBS (irritable bowel syndrome)    Lichen sclerosus    Pancreatitis    Panic attacks    Peptic ulcer     PVC (premature ventricular contraction)    SOB (shortness of breath)    with panic attack and anxiety   Wears glasses     Past Surgical History:  Procedure Laterality Date   CATARACT EXTRACTION, BILATERAL     Bil/ In 2018   LAPAROSCOPIC NISSEN FUNDOPLICATION  6314   and Repair Hiatal Hernia   LIPOMA EXCISION     MASTECTOMY W/ SENTINEL NODE BIOPSY Right 10/02/2021   Procedure: RIGHT MASTECTOMY WITH SENTINEL LYMPH NODE BIOPSY;  Surgeon: Jovita Kussmaul, MD;  Location: [facility];  Service: General;  Laterality: Right;   RECTAL EXAM UNDER ANESTHESIA N/A 12/06/2015   Procedure: ANAL  EXAM UNDER ANESTHESIA, BIOPSY;  Surgeon: Leighton Ruff, MD;  Location: [facility];  Service: General;  Laterality: N/A;   TONSILLECTOMY  age 57   TOTAL ABDOMINAL HYSTERECTOMY W/ BILATERAL SALPINGOOPHORECTOMY  1992   TOTAL MASTECTOMY Left 10/02/2021   Procedure: LEFT TOTAL MASTECTOMY;  Surgeon: Jovita Kussmaul, MD;  Location: [facility];  Service: General;  Laterality: Left;    Current Outpatient Medications  Medication Sig Dispense Refill   ALPRAZolam (XANAX) 1 MG tablet Take 1 tablet (1 mg total) by mouth 3 (  three) times daily. 30 tablet 0   celecoxib (CELEBREX) 200 MG capsule Take by mouth.     eszopiclone (LUNESTA) 2 MG TABS tablet Take 1 tab at night     FLUoxetine (PROZAC) 10 MG capsule Take 1 capsule (10 mg total) by mouth in the morning, at noon, and at bedtime.  3   gabapentin (NEURONTIN) 100 MG capsule TAKE 1 CAPSULE (100 MG TOTAL) BY MOUTH THREE TIMES DAILY.     hydrOXYzine (ATARAX) 25 MG tablet Take 25 mg by mouth 3 (three) times daily.     omeprazole (PRILOSEC) 40 MG capsule Take 1 capsule (40 mg total) by mouth 2 (two) times daily.     Vitamin D, Ergocalciferol, (DRISDOL) 1.25 MG (50000 UNIT) CAPS capsule Take 50,000 Units by mouth once a week.     No current facility-administered medications for this visit.    Family History  Problem Relation  Age of Onset   Hypertension Mother    Hyperlipidemia Mother    Stroke Mother    Diabetes Mother    Irritable bowel syndrome Mother    Kidney cancer Mother    Lung cancer Father    Irritable bowel syndrome Sister    Cystic fibrosis Other    Irritable bowel syndrome Sister    Esophageal cancer Neg Hx    Stomach cancer Neg Hx    Rectal cancer Neg Hx    Colon cancer Neg Hx     Review of Systems  Genitourinary:        Pain from mastectomy Vaginal itching at night   All other systems reviewed and are negative.  Exam:   BP 120/82 (BP Location: Left Arm, Patient Position: Sitting, Cuff Size: Normal)    Pulse 72    Resp 14    Ht _0  (1.626 m)    Wt 150 lb (68 kg)    BMI 25.75 kg/m   Weight change: _1 @ Height:   Height: _2  (162.6 cm)  Ht Readings from Last 3 Encounters:  12/08/21 _3  (1.626 m)  11/12/21 _4  (1.626 m)  11/06/21 _5  (1.626 m)    General appearance: alert, cooperative and appears stated age Head: Normocephalic, without obvious abnormality, atraumatic Neck: no adenopathy, supple, symmetrical, trachea midline and thyroid normal to inspection and palpation Lungs: clear to auscultation bilaterally Cardiovascular: regular rate and rhythm Breasts:  s/p bilateral mastectomies, no chest wall masses Abdomen: soft, non-tender; non distended,  no masses,  no organomegaly Extremities: extremities normal, atraumatic, no cyanosis or edema Skin: Skin color, texture, turgor normal. No rashes or lesions Lymph nodes: Cervical, supraclavicular, and axillary nodes normal. No abnormal inguinal nodes palpated Neurologic: Grossly normal   Pelvic: External genitalia:  atrophic, minimal whitening, mild loss of architecture.               Urethra:  normal appearing urethra with no masses, tenderness or lesions              Bartholins and Skenes: normal                 Vagina: atrophic appearing vagina with normal color and discharge, no lesions              Cervix:  absent               Bimanual Exam:  Uterus:  uterus absent              Adnexa: no mass, fullness, tenderness  Rectovaginal:deferred               Anus:  large fissure at 6 o'clock. Diffuse perianal whitening and some ecchymosis.    1. Well woman exam No mammogram needed No pap needed Colonoscopy UTD  2. Malignant neoplasm of upper-outer quadrant of right breast in female, estrogen receptor positive ([place]) S/P bilateral mastectomies in 12/22  3. Lichen sclerosus Stable, struggles more with perianal morphea.  She uses valisone prn, will call when she needs a refill.   4. History of osteopenia Will order her DEXA at [facility] Discussed calcium and vit D  5. History of anal fissures Try magnesium 500 mg a day to soften her stools - lidocaine (XYLOCAINE) 5 % ointment; Apply 1 application topically 4 (four) times daily as needed.  Dispense: 30 g; Refill: 0  6. Persistent headaches Recommended she try Excedrin migraine and f/u with primary

## 2021-12-03 ENCOUNTER — Encounter: Payer: Self-pay | Admitting: *Deleted

## 2021-12-03 ENCOUNTER — Telehealth: Payer: Self-pay | Admitting: *Deleted

## 2021-12-03 DIAGNOSIS — C50411 Malignant neoplasm of upper-outer quadrant of right female breast: Secondary | ICD-10-CM

## 2021-12-03 DIAGNOSIS — Z170 Estrogen receptor positive status [ER+]: Secondary | ICD-10-CM

## 2021-12-03 NOTE — Telephone Encounter (Signed)
Spoke with patient to follow up and see if she is interested in taking anti estrogen.  She is not sure and wants to do some research before making a decision.  Offered her an appointment to come in to discuss with Dr. Lindi Adie.  She states she will call me back and let me know.

## 2021-12-03 NOTE — Telephone Encounter (Signed)
Received call back from patient and she states she is not interested in taking the anti-estrogen but would like to come in and see Dr. Lindi Adie. Appt confirmed for 2/27 at 815am.

## 2021-12-08 ENCOUNTER — Encounter: Payer: Self-pay | Admitting: Obstetrics and Gynecology

## 2021-12-08 ENCOUNTER — Other Ambulatory Visit: Payer: Self-pay

## 2021-12-08 ENCOUNTER — Ambulatory Visit (INDEPENDENT_AMBULATORY_CARE_PROVIDER_SITE_OTHER): Payer: 59 | Admitting: Obstetrics and Gynecology

## 2021-12-08 VITALS — BP 120/82 | HR 72 | Resp 14 | Ht 64.0 in | Wt 150.0 lb

## 2021-12-08 DIAGNOSIS — Z8739 Personal history of other diseases of the musculoskeletal system and connective tissue: Secondary | ICD-10-CM

## 2021-12-08 DIAGNOSIS — R519 Headache, unspecified: Secondary | ICD-10-CM

## 2021-12-08 DIAGNOSIS — Z170 Estrogen receptor positive status [ER+]: Secondary | ICD-10-CM

## 2021-12-08 DIAGNOSIS — Z8719 Personal history of other diseases of the digestive system: Secondary | ICD-10-CM

## 2021-12-08 DIAGNOSIS — Z01419 Encounter for gynecological examination (general) (routine) without abnormal findings: Secondary | ICD-10-CM

## 2021-12-08 DIAGNOSIS — L900 Lichen sclerosus et atrophicus: Secondary | ICD-10-CM | POA: Diagnosis not present

## 2021-12-08 MED ORDER — LIDOCAINE 5 % EX OINT: 1.0000 "application " | TOPICAL_OINTMENT | Freq: Four times a day (QID) | CUTANEOUS | 0 refills | Status: DC | PRN

## 2021-12-08 NOTE — Patient Instructions (Addendum)
Try magnesium 500 mg a day for constipation.  EXERCISE   We recommended that you start or continue a regular exercise program for good health. Physical activity is anything that gets your body moving, some is better than none. The CDC recommends 150 minutes per week of Moderate-Intensity Aerobic Activity and 2 or more days of Muscle Strengthening Activity.  Benefits of exercise are limitless: helps weight loss/weight maintenance, improves mood and energy, helps with depression and anxiety, improves sleep, tones and strengthens muscles, improves balance, improves bone density, protects from chronic conditions such as heart disease, high blood pressure and diabetes and so much more. To learn more visit: https://www.cdc.gov/physicalactivity/index.html  DIET: Good nutrition starts with a healthy diet of fruits, vegetables, whole grains, and lean protein sources. Drink plenty of water for hydration. Minimize empty calories, sodium, sweets. For more information about dietary recommendations visit: https://health.gov/our-work/nutrition-physical-activity/dietary-guidelines and https://www.myplate.gov/  ALCOHOL:  Women should limit their alcohol intake to no more than 7 drinks/beers/glasses of wine (combined, not each!) per week. Moderation of alcohol intake to this level decreases your risk of breast cancer and liver damage.  If you are concerned that you may have a problem, or your friends have told you they are concerned about your drinking, there are many resources to help. A well-known program that is free, effective, and available to all people all over the nation is Alcoholics Anonymous.  Check out this site to learn more: https://www.aa.org/   CALCIUM AND VITAMIN D:  Adequate intake of calcium and Vitamin D are recommended for bone health.  You should be getting between 1000-1200 mg of calcium and 800 units of Vitamin D daily between diet and supplements  PAP SMEARS:  Pap smears, to check for cervical  cancer or precancers,  have traditionally been done yearly, scientific advances have shown that most women can have pap smears less often.  However, every woman still should have a physical exam from her gynecologist every year. It will include a breast check, inspection of the vulva and vagina to check for abnormal growths or skin changes, a visual exam of the cervix, and then an exam to evaluate the size and shape of the uterus and ovaries. We will also provide age appropriate advice regarding health maintenance, like when you should have certain vaccines, screening for sexually transmitted diseases, bone density testing, colonoscopy, mammograms, etc.   MAMMOGRAMS:  All women over 40 years old should have a routine mammogram.   COLON CANCER SCREENING: Now recommend starting at age 45. At this time colonoscopy is not covered for routine screening until 50. There are take home tests that can be done between 45-49.   COLONOSCOPY:  Colonoscopy to screen for colon cancer is recommended for all women at age 50.  We know, you hate the idea of the prep.  We agree, BUT, having colon cancer and not knowing it is worse!!  Colon cancer so often starts as a polyp that can be seen and removed at colonscopy, which can quite literally save your life!  And if your first colonoscopy is normal and you have no family history of colon cancer, most women don't have to have it again for 10 years.  Once every ten years, you can do something that may end up saving your life, right?  We will be happy to help you get it scheduled when you are ready.  Be sure to check your insurance coverage so you understand how much it will cost.  It may be covered as a   preventative service at no cost, but you should check your particular policy.      Breast Self-Awareness Breast self-awareness means being familiar with how your breasts look and feel. It involves checking your breasts regularly and reporting any changes to your health care  provider. Practicing breast self-awareness is important. A change in your breasts can be a sign of a serious medical problem. Being familiar with how your breasts look and feel allows you to find any problems early, when treatment is more likely to be successful. All women should practice breast self-awareness, including women who have had breast implants. How to do a breast self-exam One way to learn what is normal for your breasts and whether your breasts are changing is to do a breast self-exam. To do a breast self-exam: Look for Changes  Remove all the clothing above your waist. Stand in front of a mirror in a room with good lighting. Put your hands on your hips. Push your hands firmly downward. Compare your breasts in the mirror. Look for differences between them (asymmetry), such as: Differences in shape. Differences in size. Puckers, dips, and bumps in one breast and not the other. Look at each breast for changes in your skin, such as: Redness. Scaly areas. Look for changes in your nipples, such as: Discharge. Bleeding. Dimpling. Redness. A change in position. Feel for Changes Carefully feel your breasts for lumps and changes. It is best to do this while lying on your back on the floor and again while sitting or standing in the shower or tub with soapy water on your skin. Feel each breast in the following way: Place the arm on the side of the breast you are examining above your head. Feel your breast with the other hand. Start in the nipple area and make  inch (2 cm) overlapping circles to feel your breast. Use the pads of your three middle fingers to do this. Apply light pressure, then medium pressure, then firm pressure. The light pressure will allow you to feel the tissue closest to the skin. The medium pressure will allow you to feel the tissue that is a little deeper. The firm pressure will allow you to feel the tissue close to the ribs. Continue the overlapping circles,  moving downward over the breast until you feel your ribs below your breast. Move one finger-width toward the center of the body. Continue to use the  inch (2 cm) overlapping circles to feel your breast as you move slowly up toward your collarbone. Continue the up and down exam using all three pressures until you reach your armpit.  Write Down What You Find  Write down what is normal for each breast and any changes that you find. Keep a written record with breast changes or normal findings for each breast. By writing this information down, you do not need to depend only on memory for size, tenderness, or location. Write down where you are in your menstrual cycle, if you are still menstruating. If you are having trouble noticing differences in your breasts, do not get discouraged. With time you will become more familiar with the variations in your breasts and more comfortable with the exam. How often should I examine my breasts? Examine your breasts every month. If you are breastfeeding, the best time to examine your breasts is after a feeding or after using a breast pump. If you menstruate, the best time to examine your breasts is 5-7 days after your period is over. During your period,   your breasts are lumpier, and it may be more difficult to notice changes. When should I see my health care provider? See your health care provider if you notice: A change in shape or size of your breasts or nipples. A change in the skin of your breast or nipples, such as a reddened or scaly area. Unusual discharge from your nipples. A lump or thick area that was not there before. Pain in your breasts. Anything that concerns you.  

## 2021-12-12 NOTE — Progress Notes (Incomplete)
Patient Care Team: Arlyss Repress, MD as PCP - General (Internal Medicine) Rockwell Germany, RN as Oncology Nurse Navigator Mauro Kaufmann, RN as Oncology Nurse Navigator Jovita Kussmaul, MD as Consulting Physician (General Surgery) Nicholas Lose, MD as Consulting Physician (Hematology and Oncology) Eppie Gibson, MD as Attending Physician (Radiation Oncology)  DIAGNOSIS: No diagnosis found.  SUMMARY OF ONCOLOGIC HISTORY: Oncology History  Malignant neoplasm of upper-outer quadrant of right breast in female, estrogen receptor positive ([place])  07/29/2021 Initial Diagnosis   Screening mammogram: possible asymmetry in the right breast. Diagnostic mammogram and [place]: 1.1 cm irregular mass with spiculated margins and calcifications at 12:00 position. Biopsy: Grade 2 IDC and DCIS, ER+(100%)/PR-HER2 negative, Ki-67 10%   08/06/2021 Cancer Staging   Staging form: Breast, AJCC 8th Edition - Clinical stage from 08/06/2021: Stage IA (cT1b, cN0, cM0, G2, ER+, PR-, HER2-) - Signed by Nicholas Lose, MD on 08/06/2021 Stage prefix: Initial diagnosis Histologic grading system: 3 grade system    10/02/2021 Surgery   Bilateral mastectomies Left mastectomy: Benign Right mastectomy: Grade 2 IDC 1.2 cm with high-grade DCIS, margins negative, 1/2 sentinel lymph node positive, ER 100%, PR 0%, HER2 negative, Ki-67 10%     CHIEF COMPLIANT: Follow-up of right breast cancer  INTERVAL HISTORY: April [place] is a 76 y.o. with above-mentioned history of right breast cancer having undergone bilateral mastectomies. She presents to the clinic today for follow-up.   ALLERGIES:  is allergic to hydrocodone, other, and zantac [ranitidine hcl].  MEDICATIONS:  Current Outpatient Medications  Medication Sig Dispense Refill   ALPRAZolam (XANAX) 1 MG tablet Take 1 tablet (1 mg total) by mouth 3 (three) times daily. 30 tablet 0   celecoxib (CELEBREX) 200 MG capsule Take by mouth.     eszopiclone (LUNESTA) 2 MG TABS  tablet Take 1 tab at night     FLUoxetine (PROZAC) 10 MG capsule Take 1 capsule (10 mg total) by mouth in the morning, at noon, and at bedtime.  3   gabapentin (NEURONTIN) 100 MG capsule TAKE 1 CAPSULE (100 MG TOTAL) BY MOUTH THREE TIMES DAILY.     hydrOXYzine (ATARAX) 25 MG tablet Take 25 mg by mouth 3 (three) times daily.     lidocaine (XYLOCAINE) 5 % ointment Apply 1 application topically 4 (four) times daily as needed. 30 g 0   omeprazole (PRILOSEC) 40 MG capsule Take 1 capsule (40 mg total) by mouth 2 (two) times daily.     Vitamin D, Ergocalciferol, (DRISDOL) 1.25 MG (50000 UNIT) CAPS capsule Take 50,000 Units by mouth once a week.     No current facility-administered medications for this visit.    PHYSICAL EXAMINATION: ECOG PERFORMANCE STATUS: {CHL ONC ECOG PS:[phone]}  There were no vitals filed for this visit. There were no vitals filed for this visit.  BREAST:*** No palpable masses or nodules in either right or left breasts. No palpable axillary supraclavicular or infraclavicular adenopathy no breast tenderness or nipple discharge. (exam performed in the presence of a chaperone)  LABORATORY DATA:  I have reviewed the data as listed CMP Latest Ref Rng & Units 11/12/2021 08/06/2021 12/24/2020  Glucose 70 - 99 mg/dL 110(H) 76 96  BUN 8 - 23 mg/dL _0 Creatinine 0.44 - 1.00 mg/dL 1.32(H) 1.32(H) 1.33(H)  Sodium 135 - 145 mmol/L 135 142 134(L)  Potassium 3.5 - 5.1 mmol/L 4.4 3.8 4.0  Chloride 98 - 111 mmol/L 106 108 106  CO2 22 - 32 mmol/L 21(L) 24 20(L)  Calcium 8.9 - 10.3 mg/dL 9.4 9.6 9.1  °Total Protein 6.5 - 8.1 g/dL - 6.8 6.3(L)  °Total Bilirubin 0.3 - 1.2 mg/dL - 0.4 0.8  °Alkaline Phos 38 - 126 U/L - 56 56  °AST 15 - 41 U/L - 14(L) 17  °ALT 0 - 44 U/L - 14 19  ° ° °Lab Results  °Component Value Date  ° WBC 6.7 11/12/2021  ° HGB 13.6 11/12/2021  ° HCT 40.1 11/12/2021  ° MCV 87.9 11/12/2021  ° PLT 301 11/12/2021  ° NEUTROABS 4.3 08/06/2021  ° ° °ASSESSMENT & PLAN:  °No  problem-specific Assessment & Plan notes found for this encounter. ° ° ° °No orders of the defined types were placed in this encounter. ° °The patient has a good understanding of the overall plan. she agrees with it. she will call with any problems that may develop before the next visit here. ° °Total time spent: *** mins including face to face time and time spent for planning, charting and coordination of care ° °Vinay K Gudena, MD, MPH °12/12/2021 ° °I, Kirstyn Evans, am acting as scribe for Dr. Vinay Gudena. ° °{insert scribe attestation} ° ° ° ° ° °

## 2021-12-15 ENCOUNTER — Encounter: Payer: Self-pay | Admitting: Obstetrics and Gynecology

## 2021-12-15 ENCOUNTER — Inpatient Hospital Stay: Payer: 59 | Attending: Hematology and Oncology | Admitting: Hematology and Oncology

## 2021-12-15 NOTE — Assessment & Plan Note (Signed)
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

## 2022-01-08 ENCOUNTER — Telehealth (HOSPITAL_BASED_OUTPATIENT_CLINIC_OR_DEPARTMENT_OTHER): Payer: Self-pay | Admitting: Adult Health

## 2022-01-08 NOTE — Telephone Encounter (Signed)
I spoke with the patient family member today.  I was calling to get patient scheduled for survivorship care plan visit, however the family member informing that April [place] is in the ER at [facility].  She should be getting out sometime later this week.  Evidently there have been some issues with depression and questionable suicidal thoughts.  I gave her our nurse navigators phone number to call next week when she is released from the hospital so we can get her set up for her survivorship care plan visit and breast cancer follow-up. ? ?Wilber Bihari, NP 01/08/22 4:04 PM ?Medical Oncology and Hematology ?[facility] ?[address] ?[place], [place] [address] ?Tel. [phone]    Fax. [phone] ? ?

## 2022-01-09 ENCOUNTER — Encounter: Payer: Self-pay | Admitting: General Practice

## 2022-01-09 NOTE — Progress Notes (Signed)
[place] Spiritual Care Note ? ?Referred by nursing team for counseling referral per patient request. Attempted to phone Ms Fujii, but no answer. See note from [place] Causey/NP regarding [facility] ED visit for emotional and mental health support; this will likely lead to referral, but I still plan to follow up by phone on Monday. ? ? ?Chaplain Lorrin [place], MDiv, [facility] ?Pager [phone] ?Voicemail [phone]  ?

## 2022-01-12 ENCOUNTER — Encounter: Payer: Self-pay | Admitting: General Practice

## 2022-01-12 NOTE — Progress Notes (Signed)
[place] Spiritual Care Note ? ?Reached April [place] by phone at a time when she "just [wasn't] up to talking." Affirmed her choice, and we plan to try a phone call next week instead. ? ? ?Chaplain Lorrin [place], MDiv, [facility] ?Pager [phone] ?Voicemail [phone]  ?

## 2022-01-13 DIAGNOSIS — F09 Unspecified mental disorder due to known physiological condition: Secondary | ICD-10-CM | POA: Insufficient documentation

## 2022-01-19 ENCOUNTER — Encounter: Payer: Self-pay | Admitting: General Practice

## 2022-01-19 NOTE — Progress Notes (Signed)
[place] Spiritual Care Note ? ?Reached Ms Amaral by phone to introduce Spiritual Care as part of her support team. She was receptive to chaplain care and shared about recent depression, feeling better now, and relief at having a neurology appointment scheduled to investigate her memory concerns. 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 She knows that she is making a difference in people's lives. ? ?Ms Bedonie has my direct number and plans to phone as needed/desired for follow-up support. ? ? ?Chaplain Lorrin [place], MDiv, [facility] ?Pager [phone] ?Voicemail [phone]  ?

## 2022-03-03 ENCOUNTER — Encounter: Payer: Self-pay | Admitting: Obstetrics and Gynecology

## 2022-03-03 ENCOUNTER — Ambulatory Visit: Payer: 59 | Admitting: Obstetrics and Gynecology

## 2022-03-03 VITALS — BP 122/74 | HR 63 | Wt 149.0 lb

## 2022-03-03 DIAGNOSIS — L309 Dermatitis, unspecified: Secondary | ICD-10-CM

## 2022-03-03 DIAGNOSIS — N762 Acute vulvitis: Secondary | ICD-10-CM

## 2022-03-03 DIAGNOSIS — K602 Anal fissure, unspecified: Secondary | ICD-10-CM

## 2022-03-03 LAB — WET PREP FOR TRICH, YEAST, CLUE

## 2022-03-03 MED ORDER — CLOBETASOL PROPIONATE 0.05 % EX OINT: 1.0000 "application " | TOPICAL_OINTMENT | Freq: Two times a day (BID) | CUTANEOUS | 0 refills | Status: DC

## 2022-03-03 NOTE — Patient Instructions (Addendum)
Start senna, 1 tablet once a day today, tomorrow if you haven't had a soft easy bm increase to 1 tablet 2 x a day. You can go up to 2 tablets 2 x a day. ? ?The senna should make your bowel movement's the consistency of toothpaste. Keep taking the senna for 2 months after your pain has resolved, then switch back to miralax.  ? ?Use the new steroid ointment 2 x a day for 2 weeks, then follow up ? ?Anal Fissure, Adult ? ?An anal fissure is a small tear or crack in the tissue of the anus. Bleeding from a fissure usually stops on its own within a few minutes. However, bleeding will often occur again with each bowel movement until the fissure heals. ?What are the causes? ?This condition is usually caused by passing a large or hard stool (feces). Other causes include: ?Constipation. ?Frequent diarrhea. ?Inflammatory bowel disease (Crohn's disease or ulcerative colitis). ?Childbirth. ?Infections. ?Anal sex. ?What are the signs or symptoms? ?Symptoms of this condition include: ?Bleeding from the rectum. ?Small amounts of blood seen on your stool, on the toilet paper, or in the toilet after a bowel movement. The blood coats the outside of the stool and is not mixed with the stool. ?Painful bowel movements. ?Itching or irritation around the anus. ?How is this diagnosed? ?A health care provider may diagnose this condition by closely examining the anal area. An anal fissure can usually be seen with careful inspection. In some cases, a rectal exam may be performed, or a short tube (anoscope) may be used to examine the anal canal. ?How is this treated? ?Initial treatment for this condition may include: ?Taking steps to avoid constipation. This may include making changes to your diet, such as increasing your intake of fiber or fluid. ?Taking fiber supplements. These supplements can soften your stool to help make bowel movements easier. Your health care provider may also prescribe a stool softener if your stool is hard. ?Taking sitz  baths. This may help to heal the tear. ?Using medicated creams or ointments. These may be prescribed to lessen discomfort. ?Treatments that are sometimes used if initial treatments do not work well or if the condition is more severe may include: ?Botulinum injection. ?Surgery to repair the fissure. ?Follow these instructions at home: ?Eating and drinking ? ?Avoid foods that may cause constipation, such as bananas, milk, and other dairy products. ?Eat all fruits, except bananas. ?Drink enough fluid to keep your urine pale yellow. ?Eat foods that are high in fiber, such as beans, whole grains, and fresh fruits and vegetables. ?General instructions ? ?Take over-the-counter and prescription medicines only as told by your health care provider. ?Use creams or ointments only as told by your health care provider. ?Keep the anal area clean and dry. ?Take sitz baths as told by your health care provider. Do not use soap in the sitz baths. ?Keep all follow-up visits as told by your health care provider. This is important. ?Contact a health care provider if you have: ?More bleeding. ?A fever. ?Diarrhea that is mixed with blood. ?Pain that continues. ?Ongoing problems that are getting worse rather than better. ?Summary ?An anal fissure is a small tear or crack in the tissue of the anus. This condition is usually caused by passing a large or hard stool (feces). Other causes include constipation and frequent diarrhea. ?Initial treatment for this condition may include taking steps to avoid constipation, such as increasing your intake of fiber or fluid. ?Follow instructions for care as  told by your health care provider. ?Contact your health care provider if you have more bleeding or your problem is getting worse rather than better. ?Keep all follow-up visits as told by your health care provider. This is important. ?This information is not intended to replace advice given to you by your health care provider. Make sure you discuss any  questions you have with your health care provider. ?Document Revised: 05/01/2021 Document Reviewed: 05/01/2021 ?Elsevier Patient Education ? [place]. ? ?

## 2022-03-03 NOTE — Progress Notes (Signed)
GYNECOLOGY  VISIT ?  ?HPI: ?76 y.o.   Divorced [place] or Caucasian Not Hispanic or Latino  female   ?G0P0000 with No LMP recorded. Patient has had a hysterectomy.   ?here for  vulvar and rectal irritation. She is taking Miralax for constipation.  ? ?H/O lichen sclerosis, anal fissures, morphea. Really uncomfortable, occasional rectal bleeding. ?She has been using a steroid ointment 3 x a week. Has a little bit of lidocaine left, hurts to have a BM.  ?She is taking miralax, has a BM daily, hurts to have a BM (if it's soft or hard).  ? ?She is miserable off of the vaginal estrogen. Went off of it with her breast cancer diagnosis. Had a bad experience with her Oncologist, is getting a new one.  ? ?GYNECOLOGIC HISTORY: ?No LMP recorded. Patient has had a hysterectomy. ?Contraception:PMP  ?Menopausal hormone therapy: none  ?       ?OB History   ? ? Gravida  ?0  ? Para  ?0  ? Term  ?0  ? Preterm  ?0  ? AB  ?0  ? Living  ?0  ?  ? ? SAB  ?0  ? IAB  ?0  ? Ectopic  ?0  ? Multiple  ?0  ? Live Births  ?0  ?   ?  ?  ?    ? ?Patient Active Problem List  ? Diagnosis Date Noted  ? Cancer of right female breast ([place]) 10/02/2021  ? 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 Malignant neoplasm of upper-outer quadrant of right breast in female, estrogen receptor positive ([place]) 07/31/2021  ? Heartburn 12/04/2020  ? Migraines 12/04/2020  ? Pure hypercholesterolemia 10/01/2020  ? Laryngopharyngeal reflux (LPR) 09/05/2020  ? Irritable bowel syndrome with constipation 03/26/2020  ? Hair loss 10/31/2019  ? Lipid screening 04/14/2019  ? Malaise 04/14/2019  ? 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 Gastroesophageal reflux disease without esophagitis 02/10/2018  ? Vitamin D deficiency 02/10/2018  ? Pain of left lower extremity 09/15/2017  ? Phlebitis 08/12/2017  ? Panic disorder 05/30/2015  ? Persistent insomnia 01/22/2014  ? Hyperlipidemia 12/02/2013  ? Mixed emotional features as adjustment reaction  12/02/2013  ? ? ?Past Medical History:  ?Diagnosis Date  ? Anal fissure   ? Anal pain   ? Anemia   ? Anxiety   ? Blood transfusion without reported diagnosis 2002  ? After Nissen Fundiplication  ? Bradycardia   ? Breast cancer ([place])   ? Cataract   ? Chronic headaches   ? Depression   ? Endometriosis   ? GERD (gastroesophageal reflux disease)   ? Hiatal hernia   ? Hypertension   ? no meds/ in past; pt states resolved  ? IBS (irritable bowel syndrome)   ? Lichen sclerosus   ? Pancreatitis   ? Panic attacks   ? Peptic ulcer   ? PVC (premature ventricular contraction)   ? SOB (shortness of breath)   ? with panic attack and anxiety  ? Wears glasses   ? ? ?Past Surgical History:  ?Procedure Laterality Date  ? CATARACT EXTRACTION, BILATERAL    ? Bil/ In 2018  ? LAPAROSCOPIC NISSEN FUNDOPLICATION  0109  ? and Repair Hiatal Hernia  ? LIPOMA EXCISION    ? MASTECTOMY W/ SENTINEL NODE BIOPSY Right 10/02/2021  ? Procedure: RIGHT MASTECTOMY WITH SENTINEL LYMPH NODE BIOPSY;  Surgeon: Jovita Kussmaul, MD;  Location: [facility];  Service: General;  Laterality: Right;  ? RECTAL EXAM UNDER ANESTHESIA N/A 12/06/2015  ? Procedure: ANAL  EXAM UNDER ANESTHESIA, BIOPSY;  Surgeon: Leighton Ruff, MD;  Location: [facility];  Service: General;  Laterality: N/A;  ? TONSILLECTOMY  age 64  ? TOTAL ABDOMINAL HYSTERECTOMY W/ BILATERAL SALPINGOOPHORECTOMY  1992  ? TOTAL MASTECTOMY Left 10/02/2021  ? Procedure: LEFT TOTAL MASTECTOMY;  Surgeon: Jovita Kussmaul, MD;  Location: [facility];  Service: General;  Laterality: Left;  ? ? ?Current Outpatient Medications  ?Medication Sig Dispense Refill  ? ALPRAZolam (XANAX) 1 MG tablet Take 1 tablet (1 mg total) by mouth 3 (three) times daily. (Patient taking differently: Take 1 mg by mouth 3 (three) times daily as needed for anxiety. 1/2 tab) 30 tablet 0  ? eszopiclone (LUNESTA) 2 MG TABS tablet Take 1 tab at night    ? FLUoxetine (PROZAC) 10 MG capsule Take 1  capsule (10 mg total) by mouth in the morning, at noon, and at bedtime.  3  ? gabapentin (NEURONTIN) 100 MG capsule TAKE 1 CAPSULE (100 MG TOTAL) BY MOUTH THREE TIMES DAILY.    ? hydrOXYzine (ATARAX) 25 MG tablet Take 25 mg by mouth 3 (three) times daily.    ? lidocaine (XYLOCAINE) 5 % ointment Apply 1 application topically 4 (four) times daily as needed. 30 g 0  ? omeprazole (PRILOSEC) 40 MG capsule Take 1 capsule (40 mg total) by mouth 2 (two) times daily.    ? Vitamin D, Ergocalciferol, (DRISDOL) 1.25 MG (50000 UNIT) CAPS capsule Take 50,000 Units by mouth once a week.    ? ?No current facility-administered medications for this visit.  ?  ? ?ALLERGIES: Hydrocodone, Other, and Zantac [ranitidine hcl] ? ?Family History  ?Problem Relation Age of Onset  ? Hypertension Mother   ? Hyperlipidemia Mother   ? Stroke Mother   ? Diabetes Mother   ? Irritable bowel syndrome Mother   ? Kidney cancer Mother   ? Lung cancer Father   ? Irritable bowel syndrome Sister   ? Cystic fibrosis Other   ? Irritable bowel syndrome Sister   ? Esophageal cancer Neg Hx   ? Stomach cancer Neg Hx   ? Rectal cancer Neg Hx   ? Colon cancer Neg Hx   ? ? ?Social History  ? ?Socioeconomic History  ? Marital status: Divorced  ?  Spouse name: Not on file  ? Number of children: 0  ? Years of education: Not on file  ? Highest education level: Not on file  ?Occupational History  ? Occupation: Admin Assist  ?Tobacco Use  ? Smoking status: Never  ? Smokeless tobacco: Never  ?Vaping Use  ? Vaping Use: Never used  ?Substance and Sexual Activity  ? Alcohol use: Not Currently  ? Drug use: No  ? Sexual activity: Yes  ?  Partners: Male  ?  Birth control/protection: Post-menopausal  ?Other Topics Concern  ? Not on file  ?Social History Narrative  ? Not on file  ? ?Social Determinants of Health  ? ?Financial Resource Strain: Not on file  ?Food Insecurity: Not on file  ?Transportation Needs: Not on file  ?Physical Activity: Not on file  ?Stress: Not on file   ?Social Connections: Not on file  ?Intimate Partner Violence: Not on file  ? ? ?Review of Systems  ?Gastrointestinal:  Positive for constipation.  ?All other systems reviewed and are negative. ? ?PHYSICAL EXAMINATION:   ? ?BP 122/74   Pulse 63  Wt 149 lb (67.6 kg)   SpO2 99%   BMI 25.58 kg/m?     ?General appearance: alert, cooperative and appears stated age ? ?Pelvic: External genitalia:  atrophic, mild whitening, mild agglutination of the labia minora to majora ?             Urethra:  normal appearing urethra with no masses, tenderness or lesions ?             Bartholins and Skenes: normal    ?             Vagina: atrophic appearing vagina with normal color and discharge, no lesions ?             Cervix: absent ? Anus: fissure noted at 2 o'clock ? Perianal region: diffuse whitening.  ?              ? ?Chaperone was present for exam. ? ?1. Acute vulvitis ?- clobetasol ointment (TEMOVATE) 0.05 %; Apply 1 application. topically 2 (two) times daily. Apply as directed twice daily for 2 weeks  Dispense: 60 g; Refill: 0 ?- WET PREP FOR TRICH, YEAST, CLUE ?- SureSwab? Advanced Vaginitis, TMA ?-Discussed vulvar skin care, information given ?-F/U in 2 weeks ? ?2. Perianal dermatitis ?- clobetasol ointment (TEMOVATE) 0.05 %; Apply 1 application. topically 2 (two) times daily. Apply as directed twice daily for 2 weeks  Dispense: 60 g; Refill: 0 ? ?3. Anal fissure ?Recommended she try Senna  ?F/U in 2 weeks ? ? ?

## 2022-03-04 LAB — SURESWAB® ADVANCED VAGINITIS,TMA
CANDIDA SPECIES: NOT DETECTED
Candida glabrata: NOT DETECTED
SURESWAB(R) ADV BACTERIAL VAGINOSIS(BV),TMA: NEGATIVE
TRICHOMONAS VAGINALIS (TV),TMA: NOT DETECTED

## 2022-03-17 DIAGNOSIS — N1832 Chronic kidney disease, stage 3b: Secondary | ICD-10-CM | POA: Insufficient documentation

## 2022-03-18 ENCOUNTER — Encounter: Payer: Self-pay | Admitting: Obstetrics and Gynecology

## 2022-03-18 ENCOUNTER — Ambulatory Visit: Payer: 59 | Admitting: Obstetrics and Gynecology

## 2022-03-18 VITALS — BP 124/72 | HR 72 | Ht 64.0 in | Wt 148.0 lb

## 2022-03-18 DIAGNOSIS — K602 Anal fissure, unspecified: Secondary | ICD-10-CM | POA: Diagnosis not present

## 2022-03-18 DIAGNOSIS — L309 Dermatitis, unspecified: Secondary | ICD-10-CM

## 2022-03-18 DIAGNOSIS — L900 Lichen sclerosus et atrophicus: Secondary | ICD-10-CM | POA: Diagnosis not present

## 2022-03-18 NOTE — Patient Instructions (Signed)
Use the steroid ointment 2 x a week  Use Vaseline every day  Increase the senna to one tablet 2 x a day today, if it's not helping by tomorrow increase to 1 tablet in the am and 2 in the pm, if it's not helping by the next day take 2 tablets 2 x a day. You need your stools to be the consistency of tooth paste. Once you get to the right consistency it shouldn't hurt and you should continue this for at least 6 weeks.  Then go back to daily miralax.

## 2022-03-18 NOTE — Progress Notes (Signed)
GYNECOLOGY  VISIT   HPI: 76 y.o.   Divorced [place] or Caucasian Not Hispanic or Latino  female   G0P0000 with No LMP recorded. Patient has had a hysterectomy.   here for vulvitis follow up. She states that she does not feel good, nausea, pain in her axilla bilaterally. She has axillary lymphedema.   She was seen a few weeks ago with vulvitis, perianal dermatitis and an anal fissure.  H/O lichen sclerosis, anal fissures, morphea.  She has been using a steroid ointment 2 x a day, her vulvitis and dermatitis have improved. She started taking senna, only taking one a day. BM are still hard, really hurts to have a BM. Having rectal bleeding with BM.   GYNECOLOGIC HISTORY: No LMP recorded. Patient has had a hysterectomy. 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   Malignant neoplasm of upper-outer quadrant of right breast in female, estrogen receptor positive ([place]) 07/31/2021   Heartburn 12/04/2020   Migraines 12/04/2020   Pure hypercholesterolemia 10/01/2020   Laryngopharyngeal reflux (LPR) 09/05/2020   Irritable bowel syndrome with constipation 03/26/2020   Hair loss 10/31/2019   Lipid screening 04/14/2019   Malaise 04/14/2019   Major depressive disorder, recurrent, 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   Gastroesophageal reflux disease without esophagitis 02/10/2018   Vitamin D deficiency 02/10/2018   Pain of left lower extremity 09/15/2017   Phlebitis 08/12/2017   Panic disorder 05/30/2015   Persistent insomnia 01/22/2014   Hyperlipidemia 12/02/2013   Mixed emotional features as adjustment reaction 12/02/2013    Past Medical History:   Diagnosis Date   Anal fissure    Anal pain    Anemia    Anxiety    Blood transfusion without reported diagnosis 2002   After Nissen Fundiplication   Bradycardia    Breast cancer ([place])    Cataract    Chronic headaches    Depression    Endometriosis    GERD (gastroesophageal reflux disease)    Hiatal hernia    Hypertension    no meds/ in past; pt states resolved   IBS (irritable bowel syndrome)    Lichen sclerosus    Pancreatitis    Panic attacks    Peptic ulcer    PVC (premature ventricular contraction)    SOB (shortness of breath)    with panic attack and anxiety   Wears glasses     Past Surgical History:  Procedure Laterality Date   CATARACT EXTRACTION, BILATERAL     Bil/ In 2018   LAPAROSCOPIC NISSEN FUNDOPLICATION  9629   and Repair Hiatal Hernia   LIPOMA EXCISION     MASTECTOMY W/ SENTINEL NODE BIOPSY Right 10/02/2021   Procedure: RIGHT MASTECTOMY WITH SENTINEL LYMPH NODE BIOPSY;  Surgeon: Jovita Kussmaul, MD;  Location: [facility];  Service: General;  Laterality: Right;   RECTAL EXAM UNDER ANESTHESIA N/A  12/06/2015   Procedure: ANAL  EXAM UNDER ANESTHESIA, BIOPSY;  Surgeon: Leighton Ruff, MD;  Location: [facility];  Service: General;  Laterality: N/A;   TONSILLECTOMY  age 32   TOTAL ABDOMINAL HYSTERECTOMY W/ BILATERAL SALPINGOOPHORECTOMY  1992   TOTAL MASTECTOMY Left 10/02/2021   Procedure: LEFT TOTAL MASTECTOMY;  Surgeon: Jovita Kussmaul, MD;  Location: [facility];  Service: General;  Laterality: Left;    Current Outpatient Medications  Medication Sig Dispense Refill   ALPRAZolam (XANAX) 1 MG tablet Take 1 tablet (1 mg total) by mouth 3 (three) times daily. (Patient taking differently: Take 1 mg by mouth 3 (three) times daily as needed for anxiety. 1/2 tab) 30 tablet 0   clobetasol ointment (TEMOVATE) 2.67 % Apply 1 application. topically 2 (two) times daily. Apply as directed twice daily for 2 weeks 60 g 0    eszopiclone (LUNESTA) 2 MG TABS tablet Take 1 tab at night     FLUoxetine (PROZAC) 10 MG capsule Take 1 capsule (10 mg total) by mouth in the morning, at noon, and at bedtime.  3   gabapentin (NEURONTIN) 100 MG capsule TAKE 1 CAPSULE (100 MG TOTAL) BY MOUTH THREE TIMES DAILY.     hydrOXYzine (ATARAX) 25 MG tablet Take 25 mg by mouth 3 (three) times daily.     lidocaine (XYLOCAINE) 5 % ointment Apply 1 application topically 4 (four) times daily as needed. 30 g 0   omeprazole (PRILOSEC) 40 MG capsule Take 1 capsule (40 mg total) by mouth 2 (two) times daily.     Polyethylene Glycol 3350 (MIRALAX PO) Take by mouth.     Vitamin D, Ergocalciferol, (DRISDOL) 1.25 MG (50000 UNIT) CAPS capsule Take 50,000 Units by mouth once a week.     No current facility-administered medications for this visit.     ALLERGIES: Hydrocodone, Other, and Zantac [ranitidine hcl]  Family History  Problem Relation Age of Onset   Hypertension Mother    Hyperlipidemia Mother    Stroke Mother    Diabetes Mother    Irritable bowel syndrome Mother    Kidney cancer Mother    Lung cancer Father    Irritable bowel syndrome Sister    Cystic fibrosis Other    Irritable bowel syndrome Sister    Esophageal cancer Neg Hx    Stomach cancer Neg Hx    Rectal cancer Neg Hx    Colon cancer Neg Hx     Social History   Socioeconomic History   Marital status: Divorced    Spouse name: Not on file   Number of children: 0   Years of education: Not on file   Highest education level: Not on file  Occupational History   Occupation: Admin Assist  Tobacco Use   Smoking status: Never   Smokeless tobacco: Never  Vaping Use   Vaping Use: Never used  Substance and Sexual Activity   Alcohol use: Not Currently   Drug use: No   Sexual activity: Yes    Partners: Male    Birth control/protection: Post-menopausal  Other Topics Concern   Not on file  Social History Narrative   Not on file   Social Determinants of Health    Financial Resource Strain: Not on file  Food Insecurity: Not on file  Transportation Needs: Not on file  Physical Activity: Not on file  Stress: Not on file  Social Connections: Not on file  Intimate Partner Violence: Not on file    Review of  Systems  Gastrointestinal:  Positive for nausea.   PHYSICAL EXAMINATION:    BP 124/72   Pulse 72   Ht '5\' 4"'$  (1.626 m)   Wt 148 lb (67.1 kg)   SpO2 100%   BMI 25.40 kg/m     General appearance: alert, cooperative and appears stated age  Pelvic: External genitalia:  atrophic, mild whitening, mild agglutination of the labia minora to majora              Urethra:  normal appearing urethra with no masses, tenderness or lesions              Bartholins and Skenes: normal                    Anus:  fissure at 1-2 o'clock, no lesions  Perianal region: diffuse whitening  Chaperone was present for exam.  1. Anal fissure Not healing, she needs to get her stools the right consistency. Discussed use of senna, she can use up to 4 tablets a day  2. Lichen sclerosus Stable Decrease steroid ointment to 2 x a week Use Vaseline daily  3. Perianal dermatitis Improved Decrease steroid ointment to 2 x a week Use Vaseline daily

## 2022-05-24 ENCOUNTER — Other Ambulatory Visit: Payer: Self-pay | Admitting: Obstetrics and Gynecology

## 2022-05-24 DIAGNOSIS — Z8719 Personal history of other diseases of the digestive system: Secondary | ICD-10-CM

## 2022-05-25 NOTE — Telephone Encounter (Signed)
Last annual exam 11/2021 Per note on 11/2021 "History of anal fissures"

## 2022-06-10 DIAGNOSIS — D509 Iron deficiency anemia, unspecified: Secondary | ICD-10-CM | POA: Insufficient documentation

## 2022-08-23 ENCOUNTER — Other Ambulatory Visit: Payer: Self-pay | Admitting: Internal Medicine

## 2022-09-06 ENCOUNTER — Other Ambulatory Visit: Payer: Self-pay | Admitting: Internal Medicine

## 2022-09-25 DIAGNOSIS — K602 Anal fissure, unspecified: Secondary | ICD-10-CM | POA: Insufficient documentation

## 2022-11-30 NOTE — Progress Notes (Signed)
77 y.o. [place] Divorced [place] or Caucasian Not Hispanic or Latino female here for annual exam.  No longer sexually active, it was too painful. Still friends with her prior partner.    S/P bilateral mastectomy in 12/22 or a T1CN1A right breast cancer that was ER positive and PR negative and HER2 negative with a Ki-67 of 10%.   h/o morphea    H/O vaginal atrophy, dyspareunia and lichen sclerosis. She uses natural oils which help. She also uses lidocaine. She isn't using steroids, don't help and she is worried about the thinning of the skin.    H/O anal fissures, helped with Botox. Now struggling with hemorrhoids, she was told her tissue is to fragile for surgery.   No LMP recorded. Patient has had a hysterectomy.          Sexually active: No.  The current method of family planning is status post hysterectomy.    Exercising: Yes.     Walks the dog  Smoker:  no  Health Maintenance: Pap: has had hysterectomy  History of abnormal Pap:  no MMG:  11/17/21, s/p bilateral mastectomies BMD: 12/15/21  T score -2.0, FRAX 12/2.9%;  06/15/19 low bone mass T-Score -1.3, improved, FRAX 8.8/1.1% Colonoscopy: :2017 polyp - repeat in 10 yrs  TDaP:  2015 Gardasil: n/a   reports that she has never smoked. She has never used smokeless tobacco. She reports that she does not currently use alcohol. She reports that she does not use drugs. 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   Past Medical History:  Diagnosis Date   Anal fissure    Anal pain    Anemia    Anxiety    Blood transfusion without reported diagnosis 2002   After Nissen Fundiplication   Bradycardia    Breast cancer ([place])    Cataract    Chronic headaches    Depression    Endometriosis    GERD (gastroesophageal reflux disease)    Hiatal hernia    Hypertension    no meds/ in past; pt states resolved   IBS (irritable bowel syndrome)    Lichen sclerosus    Pancreatitis    Panic attacks    Peptic ulcer     PVC (premature ventricular contraction)    SOB (shortness of breath)    with panic attack and anxiety   Wears glasses     Past Surgical History:  Procedure Laterality Date   CATARACT EXTRACTION, BILATERAL     Bil/ In 2018   LAPAROSCOPIC NISSEN FUNDOPLICATION  [id number]   and Repair Hiatal Hernia   LIPOMA EXCISION     MASTECTOMY W/ SENTINEL NODE BIOPSY Right 10/02/2021   Procedure: RIGHT MASTECTOMY WITH SENTINEL LYMPH NODE BIOPSY;  Surgeon: Jovita Kussmaul, MD;  Location: [facility];  Service: General;  Laterality: Right;   RECTAL EXAM UNDER ANESTHESIA N/A 12/06/2015   Procedure: ANAL  EXAM UNDER ANESTHESIA, BIOPSY;  Surgeon: Leighton Ruff, MD;  Location: [facility];  Service: General;  Laterality: N/A;   TONSILLECTOMY  age 20   TOTAL ABDOMINAL HYSTERECTOMY W/ BILATERAL SALPINGOOPHORECTOMY  1992   TOTAL MASTECTOMY Left 10/02/2021   Procedure: LEFT TOTAL MASTECTOMY;  Surgeon: Jovita Kussmaul, MD;  Location: [facility];  Service: General;  Laterality: Left;    Current Outpatient Medications  Medication Sig Dispense Refill   ALPRAZolam (XANAX) 1 MG tablet Take 1 tablet (1 mg total) by mouth 3 (three) times daily. (  Patient taking differently: Take 1 mg by mouth 3 (three) times daily as needed for anxiety. 1/2 tab) 30 tablet 0   eszopiclone (LUNESTA) 2 MG TABS tablet Take 1 tab at night     FLUoxetine (PROZAC) 10 MG capsule Take 1 capsule (10 mg total) by mouth in the morning, at noon, and at bedtime.  3   gabapentin (NEURONTIN) 100 MG capsule TAKE 1 CAPSULE (100 MG TOTAL) BY MOUTH THREE TIMES DAILY.     hydrOXYzine (ATARAX) 25 MG tablet Take 25 mg by mouth 3 (three) times daily.     lidocaine (XYLOCAINE) 5 % ointment APPLY 1 APPLICATION TOPICALLY 4 (FOUR) TIMES DAILY AS NEEDED. 35.44 g 0   omeprazole (PRILOSEC) 40 MG capsule Take 1 capsule (40 mg total) by mouth 2 (two) times daily.     Polyethylene Glycol 3350 (MIRALAX PO) Take by mouth.     Vitamin  D, Ergocalciferol, (DRISDOL) 1.25 MG (50000 UNIT) CAPS capsule Take 50,000 Units by mouth once a week.     No current facility-administered medications for this visit.    Family History  Problem Relation Age of Onset   Hypertension Mother    Hyperlipidemia Mother    Stroke Mother    Diabetes Mother    Irritable bowel syndrome Mother    Kidney cancer Mother    Lung cancer Father    Irritable bowel syndrome Sister    Cystic fibrosis Other    Irritable bowel syndrome Sister    Esophageal cancer Neg Hx    Stomach cancer Neg Hx    Rectal cancer Neg Hx    Colon cancer Neg Hx     Review of Systems  All other systems reviewed and are negative.   Exam:   BP 128/74   Pulse 69   Ht 5' 4"$  (1.626 m)   Wt 147 lb (66.7 kg)   SpO2 100%   BMI 25.23 kg/m   Weight change: @WEIGHTCHANGE$ @ Height:   Height: 5' 4"$  (162.6 cm)  Ht Readings from Last 3 Encounters:  12/09/22 5' 4"$  (1.626 m)  03/18/22 5' 4"$  (1.626 m)  12/08/21 5' 4"$  (1.626 m)    General appearance: alert, cooperative and appears stated age Head: Normocephalic, without obvious abnormality, atraumatic Neck: no adenopathy, supple, symmetrical, trachea midline and thyroid normal Lungs: clear to auscultation bilaterally Cardiovascular: regular rate and rhythm Breasts:  evidence of bilateral mastectomies, chest wall without masses or skin changes.  Abdomen: soft, non-tender; non distended,  no masses,  no organomegaly Extremities: extremities normal, atraumatic, no cyanosis or edema Skin: Skin color, texture, turgor normal. No rashes or lesions Lymph nodes: Cervical, supraclavicular, and axillary nodes normal. No abnormal inguinal nodes palpated Neurologic: Grossly normal   Pelvic: External genitalia:  no lesions, some agglutination of the labia minora and clitoris to the labia majora. No significant whitening.              Urethra:  normal appearing urethra with no masses, tenderness or lesions              Bartholins and  Skenes: normal                 Vagina: declined              Perianal area: [place], thin skin, external hemorrhoid noted, some areas of mild skin break down.                 Gae Dry, CMA chaperoned for the exam.  1. Well woman exam Labs with primary No mammogram needed Colonoscopy utd  2. Lichen sclerosus stable  3. Malignant neoplasm of upper-outer quadrant of right breast in female, estrogen receptor positive ([place])  4. History of osteopenia DEXA due in 2/25 Taking vit D, can't take extra calcium secondary to constipation

## 2022-12-09 ENCOUNTER — Ambulatory Visit (INDEPENDENT_AMBULATORY_CARE_PROVIDER_SITE_OTHER): Payer: 59 | Admitting: Obstetrics and Gynecology

## 2022-12-09 ENCOUNTER — Encounter: Payer: Self-pay | Admitting: Obstetrics and Gynecology

## 2022-12-09 VITALS — BP 128/74 | HR 69 | Ht 64.0 in | Wt 147.0 lb

## 2022-12-09 DIAGNOSIS — Z01419 Encounter for gynecological examination (general) (routine) without abnormal findings: Secondary | ICD-10-CM

## 2022-12-09 DIAGNOSIS — L900 Lichen sclerosus et atrophicus: Secondary | ICD-10-CM | POA: Diagnosis not present

## 2022-12-09 DIAGNOSIS — Z8739 Personal history of other diseases of the musculoskeletal system and connective tissue: Secondary | ICD-10-CM

## 2022-12-09 DIAGNOSIS — C50411 Malignant neoplasm of upper-outer quadrant of right female breast: Secondary | ICD-10-CM

## 2022-12-09 DIAGNOSIS — Z170 Estrogen receptor positive status [ER+]: Secondary | ICD-10-CM

## 2022-12-09 NOTE — Patient Instructions (Signed)

## 2023-02-16 ENCOUNTER — Ambulatory Visit: Payer: 59 | Admitting: Obstetrics and Gynecology

## 2023-02-16 ENCOUNTER — Encounter: Payer: Self-pay | Admitting: Obstetrics and Gynecology

## 2023-02-16 VITALS — BP 128/72 | HR 68 | Wt 145.0 lb

## 2023-02-16 DIAGNOSIS — L309 Dermatitis, unspecified: Secondary | ICD-10-CM | POA: Diagnosis not present

## 2023-02-16 DIAGNOSIS — N952 Postmenopausal atrophic vaginitis: Secondary | ICD-10-CM | POA: Diagnosis not present

## 2023-02-16 DIAGNOSIS — L900 Lichen sclerosus et atrophicus: Secondary | ICD-10-CM | POA: Diagnosis not present

## 2023-02-16 MED ORDER — CLOBETASOL PROPIONATE 0.05 % EX OINT
TOPICAL_OINTMENT | CUTANEOUS | 1 refills | Status: AC
Start: 2023-02-16 — End: ?

## 2023-02-16 NOTE — Progress Notes (Signed)
GYNECOLOGY  VISIT   HPI: 77 y.o.   Divorced [place] or Caucasian Not Hispanic or Latino  female   G0P0000 with No LMP recorded. Patient has had a hysterectomy.   here for vulvar irritation. She states that her vaginal opening is getting smaller. She has a lot of stress at home. She feels like she can't have intercourse and would like to be able to.   Her biggest issue was anal fissures, she had botox injections but it didn't help much. Her skin on her vulva and perianal area is very thin and irritated. Her vaginal opening is getting smaller. Things got much worse when she stopped the vaginal estrogen when she was diagnosed with breast cancer in 12/22.  She is using a vaginal moisturizer.   She has lichen sclerosis, she does have itching. She isn't using steroids.   GYNECOLOGIC HISTORY: No LMP recorded. Patient has had a hysterectomy. 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   Malignant neoplasm of upper-outer quadrant of right breast in female, estrogen receptor positive (HCC) 07/31/2021   Heartburn 12/04/2020   Migraines 12/04/2020   Pure hypercholesterolemia 10/01/2020   Laryngopharyngeal reflux (LPR) 09/05/2020   Irritable bowel syndrome with constipation 03/26/2020   Hair loss 10/31/2019   Lipid screening 04/14/2019   Malaise 04/14/2019   Major depressive disorder, recurrent, 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   Gastroesophageal reflux disease without esophagitis 02/10/2018    Vitamin D deficiency 02/10/2018   Pain of left lower extremity 09/15/2017   Phlebitis 08/12/2017   Panic disorder 05/30/2015   Persistent insomnia 01/22/2014   Hyperlipidemia 12/02/2013   Mixed emotional features as adjustment reaction 12/02/2013    Past Medical History:  Diagnosis Date   Anal fissure    Anal pain    Anemia    Anxiety    Blood transfusion without reported diagnosis 2002   After Nissen Fundiplication   Bradycardia    Breast cancer (HCC)    Cataract    Chronic headaches    Depression    Endometriosis    GERD (gastroesophageal reflux disease)    Hiatal hernia    Hypertension    no meds/ in past; pt states resolved   IBS (irritable bowel syndrome)    Lichen sclerosus    Pancreatitis    Panic attacks    Peptic ulcer    PVC (premature ventricular contraction)    SOB (shortness of breath)    with panic attack and anxiety   Wears glasses     Past Surgical History:  Procedure Laterality Date   CATARACT EXTRACTION, BILATERAL     Bil/ In 2018   LAPAROSCOPIC NISSEN FUNDOPLICATION  2002   and Repair Hiatal Hernia   LIPOMA  EXCISION     MASTECTOMY W/ SENTINEL NODE BIOPSY Right 10/02/2021   Procedure: RIGHT MASTECTOMY WITH SENTINEL LYMPH NODE BIOPSY;  Surgeon: Griselda Miner, MD;  Location: [facility];  Service: General;  Laterality: Right;   RECTAL EXAM UNDER ANESTHESIA N/A 12/06/2015   Procedure: ANAL  EXAM UNDER ANESTHESIA, BIOPSY;  Surgeon: Romie Levee, MD;  Location: [facility];  Service: General;  Laterality: N/A;   TONSILLECTOMY  age 63   TOTAL ABDOMINAL HYSTERECTOMY W/ BILATERAL SALPINGOOPHORECTOMY  1992   TOTAL MASTECTOMY Left 10/02/2021   Procedure: LEFT TOTAL MASTECTOMY;  Surgeon: Griselda Miner, MD;  Location: [facility];  Service: General;  Laterality: Left;    Current Outpatient Medications  Medication Sig Dispense Refill   ALPRAZolam (XANAX) 1 MG tablet Take 1 tablet (1 mg total) by mouth 3 (three)  times daily. (Patient taking differently: Take 1 mg by mouth 3 (three) times daily as needed for anxiety. 1/2 tab) 30 tablet 0   eszopiclone (LUNESTA) 2 MG TABS tablet Take 1 tab at night     FLUoxetine (PROZAC) 10 MG capsule Take 1 capsule (10 mg total) by mouth in the morning, at noon, and at bedtime.  3   gabapentin (NEURONTIN) 100 MG capsule TAKE 1 CAPSULE (100 MG TOTAL) BY MOUTH THREE TIMES DAILY.     hydrOXYzine (ATARAX) 25 MG tablet Take 25 mg by mouth 3 (three) times daily.     lidocaine (XYLOCAINE) 5 % ointment APPLY 1 APPLICATION TOPICALLY 4 (FOUR) TIMES DAILY AS NEEDED. 35.44 g 0   omeprazole (PRILOSEC) 40 MG capsule Take 1 capsule (40 mg total) by mouth 2 (two) times daily.     Polyethylene Glycol 3350 (MIRALAX PO) Take by mouth.     Vitamin D, Ergocalciferol, (DRISDOL) 1.25 MG (50000 UNIT) CAPS capsule Take 50,000 Units by mouth once a week.     No current facility-administered medications for this visit.     ALLERGIES: Hydrocodone, Other, and Zantac [ranitidine hcl]  Family History  Problem Relation Age of Onset   Hypertension Mother    Hyperlipidemia Mother    Stroke Mother    Diabetes Mother    Irritable bowel syndrome Mother    Kidney cancer Mother    Lung cancer Father    Irritable bowel syndrome Sister    Cystic fibrosis Other    Irritable bowel syndrome Sister    Esophageal cancer Neg Hx    Stomach cancer Neg Hx    Rectal cancer Neg Hx    Colon cancer Neg Hx     Social History   Socioeconomic History   Marital status: Divorced    Spouse name: Not on file   Number of children: 0   Years of education: Not on file   Highest education level: Not on file  Occupational History   Occupation: Admin Assist  Tobacco Use   Smoking status: Never   Smokeless tobacco: Never  Vaping Use   Vaping Use: Never used  Substance and Sexual Activity   Alcohol use: Not Currently   Drug use: No   Sexual activity: Yes    Partners: Male    Birth control/protection:  Post-menopausal  Other Topics Concern   Not on file  Social History Narrative   Not on file   Social Determinants of Health   Financial Resource Strain: Not on file  Food Insecurity: Not on file  Transportation Needs: Not on file  Physical Activity: Not on file  Stress:  Not on file  Social Connections: Not on file  Intimate Partner Violence: Not on file    Review of Systems  All other systems reviewed and are negative.   PHYSICAL EXAMINATION:    BP 128/72   Pulse 68   Wt 145 lb (65.8 kg)   SpO2 100%   BMI 24.89 kg/m     General appearance: alert, cooperative and appears stated age  Pelvic: External genitalia:  no lesions, mild whitening, mild agglutination of the clitoris and labia minora to the labia majora              Urethra:  normal appearing urethra with no masses, tenderness or lesions              Bartholins and Skenes: normal                 Vagina: atrophic appearing vagina with no discharge, no lesions. Able to get 2 fingers a few cm past the introitus.               Cervix: absent  Perianal skin: excoriated at the anal area, diffuse whitening.                 Chaperone was present for exam.  1. Vaginal atrophy Worsened since she had to stop using vaginal estrogen in 12/22. We discussed that with her ER + breast cancer, using vaginal estrogen could potentially increase her risk of recurrent breast cancer. Encouraged her to speak with her Oncologist. We also briefly discussed osphena.  -Will order her vaginal dilators (she would prefer we get them for her), given information on usage. Use with lubrication   2. Lichen sclerosus Discussed vulvar and perianal skin care. - clobetasol ointment (TEMOVATE) 0.05 %; Apply a small amount twice daily for up to 2 weeks as needed. Then space out to baseline use of 1-2 x a week.  Dispense: 60 g; Refill: 1  3. Perianal dermatitis Discussed vulvar and perianal skin care.  - clobetasol ointment (TEMOVATE) 0.05 %; Apply a  small amount twice daily for up to 2 weeks as needed. Then space out to baseline use of 1-2 x a week.  Dispense: 60 g; Refill: 1

## 2023-02-17 ENCOUNTER — Telehealth: Payer: Self-pay | Admitting: Obstetrics and Gynecology

## 2023-02-17 ENCOUNTER — Telehealth: Payer: Self-pay

## 2023-02-17 NOTE — Telephone Encounter (Signed)
The patient and I talked about her pain and spasms from her anal fissure yesterday. She didn't do well with nitroglycerine in the past.  After looking into options I would recommend a compounded topical nifedipine 0.2% ointment combined with a 5% lidocaine ointment. Apply a pea sized amount to the anus 2-4 x a day prn. #30 grams.  -I called the patient and reviewed this with her. Discussed side effects can include headaches and dizziness. She would like to try this. Will reach out with any concerns.

## 2023-02-17 NOTE — Telephone Encounter (Signed)
Patient called in voice mail stating she needed Dr. Oscar La to call her back. I called patient to see if  I could be of help.  She wanted Dr. Oscar La to know that the Clobetasol had helped immensely with just one application.  She wanted Dr. Oscar La to know that she learned of her retirement and she is so sad she cried about it.  I assured her Dr. Edward Jolly practices very much like Dr. Oscar La and is very caring as well and would be happy to see her when Dr. Oscar La has left.  Patient just wanted Dr. Oscar La to know how appreciative she is of her excellent care.

## 2023-02-18 MED ORDER — NONFORMULARY OR COMPOUNDED ITEM: 0 refills | Status: DC

## 2023-02-18 NOTE — Telephone Encounter (Signed)
I spoke with patient and confirmed she would like to use [facility] for compounding. I let her know they will be calling her.  Rx phoned in to the pharmacist there.

## 2023-06-24 ENCOUNTER — Other Ambulatory Visit: Payer: Self-pay | Admitting: Internal Medicine

## 2023-07-17 ENCOUNTER — Other Ambulatory Visit: Payer: Self-pay | Admitting: Internal Medicine

## 2023-08-14 ENCOUNTER — Other Ambulatory Visit: Payer: Self-pay | Admitting: Internal Medicine

## 2024-04-05 ENCOUNTER — Encounter: Payer: Self-pay | Admitting: Obstetrics and Gynecology

## 2024-04-05 ENCOUNTER — Ambulatory Visit: Admitting: Obstetrics and Gynecology

## 2024-04-05 VITALS — BP 100/62 | HR 81 | Temp 97.6°F | Wt 144.0 lb

## 2024-04-05 DIAGNOSIS — L900 Lichen sclerosus et atrophicus: Secondary | ICD-10-CM | POA: Diagnosis not present

## 2024-04-05 DIAGNOSIS — Z8719 Personal history of other diseases of the digestive system: Secondary | ICD-10-CM | POA: Diagnosis not present

## 2024-04-05 DIAGNOSIS — N952 Postmenopausal atrophic vaginitis: Secondary | ICD-10-CM

## 2024-04-05 DIAGNOSIS — N941 Unspecified dyspareunia: Secondary | ICD-10-CM | POA: Diagnosis not present

## 2024-04-05 MED ORDER — BETAMETHASONE VALERATE 0.1 % EX OINT
1.0000 | TOPICAL_OINTMENT | Freq: Every day | CUTANEOUS | 5 refills | Status: AC
Start: 2024-04-05 — End: ?

## 2024-04-05 MED ORDER — LIDOCAINE 5 % EX OINT: TOPICAL_OINTMENT | Freq: Four times a day (QID) | CUTANEOUS | 3 refills | Status: AC | PRN

## 2024-04-05 MED ORDER — BETAMETHASONE VALERATE 0.1 % EX OINT: 1.0000 | TOPICAL_OINTMENT | Freq: Two times a day (BID) | CUTANEOUS | 5 refills | Status: DC

## 2024-04-05 NOTE — Progress Notes (Signed)
 78 y.o. G0P0000 female with history of prior hysterectomy (endometriosis), right breast cancer (dx'd 2022, s/p bilateral mastectomy, declined chemo RT), GSM, lichen sclerosis here for follow-up. Divorced.  No LMP recorded. Patient has had a hysterectomy.   She reports ongoing pain with intercourse, managed with topical lidocaine .  Using betamethasone  less frequently than she should.  Feels that this works better than clobetasol .  Was doing well vaginal estrogen however this was discontinued after her breast cancer diagnosis in 2022.  Encouraged patient to discuss topical estrogen use with oncologist.  Next appointment with them July.  Sexually active: Yes, boyfriend   GYN HISTORY: As noted above vaginal atrophy  OB History  Gravida Para Term Preterm AB Living  0 0 0 0 0 0  SAB IAB Ectopic Multiple Live Births  0 0 0 0 0   Past Medical History:  Diagnosis Date   Anal fissure    Anal pain    Anemia    Anxiety    Blood transfusion without reported diagnosis 2002   After Nissen Fundiplication   Bradycardia    Breast cancer (HCC)    Cataract    Chronic headaches    Depression    Endometriosis    GERD (gastroesophageal reflux disease)    Hiatal hernia    Hypertension    no meds/ in past; pt states resolved   IBS (irritable bowel syndrome)    Lichen sclerosus    Pancreatitis    Panic attacks    Peptic ulcer    PVC (premature ventricular contraction)    SOB (shortness of breath)    with panic attack and anxiety   Wears glasses    Past Surgical History:  Procedure Laterality Date   CATARACT EXTRACTION, BILATERAL     Bil/ In 2018   LAPAROSCOPIC NISSEN FUNDOPLICATION  2002   and Repair Hiatal Hernia   LIPOMA EXCISION     MASTECTOMY W/ SENTINEL NODE BIOPSY Right 10/02/2021   Procedure: RIGHT MASTECTOMY WITH SENTINEL LYMPH NODE BIOPSY;  Surgeon: Caralyn Chandler, MD;  Location: [facility];  Service: General;  Laterality: Right;   RECTAL EXAM UNDER  ANESTHESIA N/A 12/06/2015   Procedure: ANAL  EXAM UNDER ANESTHESIA, BIOPSY;  Surgeon: Joyce Nixon, MD;  [facility];  Service: General;  Laterality: N/A;   TONSILLECTOMY  age 49   TOTAL ABDOMINAL HYSTERECTOMY W/ BILATERAL SALPINGOOPHORECTOMY  1992   TOTAL MASTECTOMY Left 10/02/2021   Procedure: LEFT TOTAL MASTECTOMY;  Surgeon: Lillette Reid III, MD;  [facility];  Service: General;  Laterality: Left;   Current Outpatient Medications on File Prior to Visit  Medication Sig Dispense Refill   ALPRAZolam  (XANAX ) 1 MG tablet Take 1 tablet (1 mg total) by mouth 3 (three) times daily. (Patient taking differently: Take 1 mg by mouth 3 (three) times daily as needed for anxiety. 1/2 tab) 30 tablet 0   eszopiclone (LUNESTA) 2 MG TABS tablet Take 1 tab at night     FLUoxetine  (PROZAC ) 10 MG capsule Take 1 capsule (10 mg total) by mouth in the morning, at noon, and at bedtime.  3   gabapentin  (NEURONTIN ) 100 MG capsule TAKE 1 CAPSULE (100 MG TOTAL) BY MOUTH THREE TIMES DAILY.     hydrOXYzine (ATARAX) 25 MG tablet Take 25 mg by mouth 3 (three) times daily.     omeprazole  (PRILOSEC) 40 MG capsule Take 1 capsule (40 mg total) by mouth 2 (two) times daily.  pantoprazole  (PROTONIX ) 40 MG tablet Oral     Polyethylene Glycol 3350  (MIRALAX  PO) Take by mouth.     Vitamin D, Ergocalciferol, (DRISDOL) 1.25 MG (50000 UNIT) CAPS capsule Take 50,000 Units by mouth once a week.     ZEPBOUND 5 MG/0.5ML Pen Inject 5 mg into the skin.     No current facility-administered medications on file prior to visit.   Allergies  Allergen Reactions   Hydrocodone Itching    severe   Other Other (See Comments)    Severe headaches w/ most gerd medication IV and PO    Zantac [Ranitidine Hcl] Other (See Comments)    headaches      PE Today's Vitals   04/05/24 0916  BP: 100/62  Pulse: 81  Temp: 97.6 F (36.4 C)  TempSrc: Oral  SpO2: 97%  Weight: 144 lb (65.3 kg)   Body  mass index is 24.72 kg/m.  Physical Exam Vitals reviewed.  Constitutional:      General: She is not in acute distress.    Appearance: Normal appearance.  HENT:     Head: Normocephalic and atraumatic.     Nose: Nose normal.   Eyes:     Extraocular Movements: Extraocular movements intact.     Conjunctiva/sclera: Conjunctivae normal.   Pulmonary:     Effort: Pulmonary effort is normal.  Genitourinary:     Comments: Vulvar atrophy Declined speculum and internal exam  Musculoskeletal:        General: Normal range of motion.     Cervical back: Normal range of motion.   Neurological:     General: No focal deficit present.     Mental Status: She is alert.   Psychiatric:        Mood and Affect: Mood normal.        Behavior: Behavior normal.       Assessment and Plan:        Lichen sclerosus Vaginal atrophy Dyspareunia in female History of anal fissures -     Lidocaine ; Apply topically 4 (four) times daily as needed.  Dispense: 35 g; Refill: 3 -     Betamethasone  Valerate; Apply 1 Application topically 2 (two) times daily.  Dispense: 60 g; Refill: 5 Recommend daily use of betamethasone  for 3 months.  Continued use of lidocaine  as needed with intercourse.  Follow-up in office in 3 months to reassess right labia minora lesion.  Patient open to biopsy at that time if unimproved.    Romaine Closs, MD

## 2024-06-01 ENCOUNTER — Encounter (HOSPITAL_BASED_OUTPATIENT_CLINIC_OR_DEPARTMENT_OTHER): Payer: Self-pay

## 2024-06-01 ENCOUNTER — Other Ambulatory Visit: Payer: Self-pay

## 2024-06-01 ENCOUNTER — Emergency Department (HOSPITAL_BASED_OUTPATIENT_CLINIC_OR_DEPARTMENT_OTHER): Admission: EM | Admit: 2024-06-01 | Discharge: 2024-06-01 | Disposition: A | Discharge status: Home / Self Care

## 2024-06-01 ENCOUNTER — Emergency Department (HOSPITAL_BASED_OUTPATIENT_CLINIC_OR_DEPARTMENT_OTHER)

## 2024-06-01 DIAGNOSIS — S99911A Unspecified injury of right ankle, initial encounter: Secondary | ICD-10-CM | POA: Diagnosis present

## 2024-06-01 DIAGNOSIS — W010XXA Fall on same level from slipping, tripping and stumbling without subsequent striking against object, initial encounter: Secondary | ICD-10-CM | POA: Diagnosis not present

## 2024-06-01 DIAGNOSIS — S8251XA Displaced fracture of medial malleolus of right tibia, initial encounter for closed fracture: Secondary | ICD-10-CM | POA: Diagnosis not present

## 2024-06-01 DIAGNOSIS — S82891A Other fracture of right lower leg, initial encounter for closed fracture: Secondary | ICD-10-CM

## 2024-06-01 NOTE — ED Notes (Addendum)
 Pt alert and oriented X 4 at the time of discharge. RR even and unlabored. No acute distress noted. Pt verbalized understanding of discharge instructions as discussed. Pt transported in wheelchair to lobby at time of discharge.

## 2024-06-01 NOTE — Discharge Instructions (Signed)
 Please remain in the cam boot until you are able to be seen by the orthopedic doctor.  He may take over-the-counter medication such as Tylenol  or ibuprofen for pain.  Return for fevers, chills, severe pain, becomes blue, cold, pale or any new or worsening symptoms that are concerning to

## 2024-06-01 NOTE — ED Provider Notes (Addendum)
 [place] [facility]GENCY DEPARTMENT AT [facility] [place] Provider Note   CSN: [id number] Arrival date & time: 06/01/24  9240     Patient presents with: Ankle Pain   April Houston is a 78 y.o. female.   78 year old with right ankle pain.  Tripped over her purse yesterday twisted and fell.  Did not hit her head, no LOC.  Was able to ambulate with antalgic gait yesterday, but this morning had increased pain and difficulty ambulating.  No headache, vision changes, chest pain, shortness of breath abdominal pain.  Only complains of pain to her right medial malleolus   Ankle Pain      Prior to Admission medications   Medication Sig Start Date End Date Taking? Authorizing Provider  ALPRAZolam  (XANAX ) 1 MG tablet Take 1 tablet (1 mg total) by mouth 3 (three) times daily. Patient taking differently: Take 1 mg by mouth 3 (three) times daily as needed for anxiety. 1/2 tab 11/06/21   Gudena, Vinay, MD  betamethasone  valerate ointment (VALISONE ) 0.1 % Apply 1 Application topically daily. 04/05/24   Dallie Vera GAILS, MD  eszopiclone (LUNESTA) 2 MG TABS tablet Take 1 tab at night 11/28/21   [provider]  FLUoxetine  (PROZAC ) 10 MG capsule Take 1 capsule (10 mg total) by mouth in the morning, at noon, and at bedtime. 11/06/21   Gudena, Vinay, MD  gabapentin  (NEURONTIN ) 100 MG capsule TAKE 1 CAPSULE (100 MG TOTAL) BY MOUTH THREE TIMES DAILY. 10/21/21   [provider]  hydrOXYzine (ATARAX) 25 MG tablet Take 25 mg by mouth 3 (three) times daily. 12/03/21   [provider]  lidocaine  (XYLOCAINE ) 5 % ointment Apply topically 4 (four) times daily as needed. 04/05/24   Hines, Vera GAILS, MD  omeprazole  (PRILOSEC) 40 MG capsule Take 1 capsule (40 mg total) by mouth 2 (two) times daily. 11/06/21   Gudena, Vinay, MD  pantoprazole  (PROTONIX ) 40 MG tablet Oral 02/02/18   [provider]  Polyethylene Glycol 3350  (MIRALAX  PO) Take by mouth.    [provider]  Vitamin D,  Ergocalciferol, (DRISDOL) 1.25 MG (50000 UNIT) CAPS capsule Take 50,000 Units by mouth once a week. 05/20/21   [provider]  ZEPBOUND 5 MG/0.5ML Pen Inject 5 mg into the skin. 03/03/24   [provider]    Allergies: Hydrocodone, Other, and Zantac [ranitidine hcl]    Review of Systems  Updated Vital Signs BP 100/64   Pulse 71   Temp 98.1 F (36.7 C)   Resp 18   SpO2 95%   Physical Exam Vitals and nursing note reviewed.  Constitutional:      General: She is not in acute distress.    Appearance: She is not toxic-appearing.  HENT:     Head: Normocephalic.     Nose: Nose normal.     Mouth/Throat:     Mouth: Mucous membranes are moist.  Eyes:     Conjunctiva/sclera: Conjunctivae normal.  Cardiovascular:     Rate and Rhythm: Normal rate and regular rhythm.  Pulmonary:     Effort: Pulmonary effort is normal.     Breath sounds: Normal breath sounds.  Abdominal:     General: Abdomen is flat.     Palpations: Abdomen is soft.  Musculoskeletal:     Cervical back: Normal range of motion.  Skin:    General: Skin is warm.     Capillary Refill: Capillary refill takes less than 2 seconds.  Neurological:     Mental Status: She  is alert and oriented to person, place, and time.  Psychiatric:        Mood and Affect: Mood normal.        Behavior: Behavior normal.     (all labs ordered are listed, but only abnormal results are displayed) Labs Reviewed - No data to display  EKG: None  Radiology: DG Ankle Complete Right Result Date: 06/01/2024 CLINICAL DATA:  Fall. EXAM: RIGHT ANKLE - COMPLETE 3+ VIEW COMPARISON:  None Available. FINDINGS: 5 mm ossicle adjacent to the tip of the medial malleolus with suggestion of underlying cortical irregularity. This could reflect sequela of prior ligamentous trauma, although, acute fracture cannot be excluded. The remainder of the visualized bones appear intact. Ankle mortise is congruent. Plantar calcaneal spur. IMPRESSION: 5 mm  ossicle adjacent to the tip of the medial malleolus with suggestion of underlying cortical irregularity. This could reflect sequela of prior ligamentous trauma, although, acute fracture cannot be excluded. Recommend correlation with point tenderness. Electronically Signed   By: Harrietta Sherry M.D.   On: 06/01/2024 10:01     Procedures   Medications Ordered in the ED - No data to display  Clinical Course as of 06/01/24 1010  Thu Jun 01, 2024  1007 DG Ankle Complete Right IMPRESSION: 5 mm ossicle adjacent to the tip of the medial malleolus with suggestion of underlying cortical irregularity. This could reflect sequela of prior ligamentous trauma, although, acute fracture cannot be excluded. Recommend correlation with point tenderness.   Electronically Signed   [TY]    Clinical Course User Index [TY] Neysa Caron PARAS, DO                                 Medical Decision Making This is a 78 year old with right ankle pain after mechanical fall.  Vital signs reassuring.  Exam with some tenderness to the right medial malleolus, no other signs of trauma or bony tenderness.  Considered CT head, however not on blood thinners, did not hit her head and is neuro intact with a fall that happened yesterday.  Low suspicion for acute intracranial pathology at this time.  X-ray independent reviewed, questionable medial malleolus avulsion type fracture.  Awaiting radiology's final overread.  However patient notes that she is having significant pain with ambulation.  Soft compartments.  Neurovascularly intact.  Will place in cam boot for supportive care.  Will have her follow-up with Ortho.  XR suspicions for fracture; although not definitive.   Amount and/or Complexity of Data Reviewed Radiology: ordered. Decision-making details documented in ED Course.      Final diagnoses:  Closed fracture of right ankle, initial encounter    ED Discharge Orders     None          Neysa Caron PARAS,  DO 06/01/24 0914    Neysa Caron PARAS, DO 06/01/24 1010

## 2024-06-01 NOTE — ED Triage Notes (Signed)
 Pt presents with complaints of right ankle pain from a mechanically fall yesterday. States that she tripped, twisted right ankle and fell. Denies any other injuries. No Head injury, No thinners

## 2024-07-06 ENCOUNTER — Ambulatory Visit: Admitting: Obstetrics and Gynecology

## 2024-07-10 ENCOUNTER — Ambulatory Visit: Admitting: Obstetrics and Gynecology

## 2024-07-11 ENCOUNTER — Ambulatory Visit: Admitting: Podiatry

## 2024-07-11 ENCOUNTER — Ambulatory Visit (INDEPENDENT_AMBULATORY_CARE_PROVIDER_SITE_OTHER)

## 2024-07-11 DIAGNOSIS — M25572 Pain in left ankle and joints of left foot: Secondary | ICD-10-CM

## 2024-07-11 DIAGNOSIS — M25571 Pain in right ankle and joints of right foot: Secondary | ICD-10-CM | POA: Diagnosis not present

## 2024-07-11 DIAGNOSIS — M65971 Unspecified synovitis and tenosynovitis, right ankle and foot: Secondary | ICD-10-CM | POA: Diagnosis not present

## 2024-07-11 MED ORDER — PREDNISONE 5 MG PO TABS: ORAL_TABLET | ORAL | 0 refills | Status: DC

## 2024-07-11 NOTE — Progress Notes (Signed)
 Presents today with complaint of pain along the medial aspect of the foot and ankle on the right.  She says she had a little hairline fracture several months ago that was painful had been doing fine but she twisted her foot getting out of the car.  Did not notice any ecchymosis or redness.  Some pain with walking.  It is not making her limp.   Physical exam:  General appearance: Pleasant, and in no acute distress. AOx3.  Vascular: Pedal pulses: DP 2/4 bilaterally, PT 2/4 bilaterally.  Minimal edema along the medial ankle right.  Mild edema lower legs bilaterally. Capillary fill time immediate bilaterally.  Neurological: Light touch intact feet bilaterally.  Normal Achilles reflex bilaterally.  No clonus or spasticity noted.   Dermatologic:   Skin normal temperature bilaterally.  Skin normal color, tone, and texture bilaterally.   Musculoskeletal: Tenderness along the medial ankle along the posterior tibial tendon from the medial malleolus to about halfway to the navicular tuberosity.  Tenderness at PTT with inversion against resistance right.  Normal muscle strength lower extremity bilaterally.  Radiographs: 3 views ankle right: No evidence of any fractures or dislocations.  Normal ankle joint space.  There is a small osteophytic change at the distal active aspect of the malleolus and a small dense body probably osseous loose body and soft tissue in the ankle.  Diagnosis: 1.  Posterior tibial tenosynovitis right 2.  Acute pain ankle right  Plan: -New patient office visit Level 3 for evaluation and management. -Discussed with her pain along the posterior tibial tendon.  Would recommend oral prednisone  for 12-day taper to reduce the inflammation.  Wear good stable shoe.  Will see how she does with this.  If she still having pain we can try an injection and that appear to have any fractures or any tear of the tendon. -RICE  Return 2 weeks follow-up PTT tenosynovitis right.

## 2024-07-19 ENCOUNTER — Ambulatory Visit: Admitting: Obstetrics and Gynecology

## 2024-07-26 ENCOUNTER — Ambulatory Visit: Admitting: Podiatry

## 2024-10-04 ENCOUNTER — Encounter (HOSPITAL_BASED_OUTPATIENT_CLINIC_OR_DEPARTMENT_OTHER): Payer: Self-pay

## 2024-10-04 ENCOUNTER — Emergency Department (HOSPITAL_BASED_OUTPATIENT_CLINIC_OR_DEPARTMENT_OTHER)

## 2024-10-04 ENCOUNTER — Other Ambulatory Visit: Payer: Self-pay

## 2024-10-04 ENCOUNTER — Emergency Department (HOSPITAL_BASED_OUTPATIENT_CLINIC_OR_DEPARTMENT_OTHER): Admission: EM | Admit: 2024-10-04 | Discharge: 2024-10-04 | Disposition: A | Discharge status: Home / Self Care

## 2024-10-04 DIAGNOSIS — R053 Chronic cough: Secondary | ICD-10-CM | POA: Diagnosis not present

## 2024-10-04 DIAGNOSIS — R059 Cough, unspecified: Secondary | ICD-10-CM | POA: Diagnosis present

## 2024-10-04 MED ORDER — ALBUTEROL SULFATE HFA 108 (90 BASE) MCG/ACT IN AERS
2.0000 | INHALATION_SPRAY | Freq: Once | RESPIRATORY_TRACT | Status: AC
  Administered 2024-10-04: 08:00:00 2 via RESPIRATORY_TRACT
  Filled 2024-10-04: qty 6.7

## 2024-10-04 MED ORDER — BENZONATATE 100 MG PO CAPS: 100.0000 mg | ORAL_CAPSULE | Freq: Three times a day (TID) | ORAL | 0 refills | Status: AC

## 2024-10-04 MED ORDER — PREDNISONE 10 MG (21) PO TBPK: ORAL_TABLET | Freq: Every day | ORAL | 0 refills | Status: AC

## 2024-10-04 NOTE — ED Triage Notes (Signed)
 Coughing since Oct 30, but has been coughing constantly since 2 a. Cough is productive

## 2024-10-04 NOTE — ED Provider Notes (Signed)
 [place] EMERGENCY DEPARTMENT AT [facility] [place] Provider Note   CSN: [id number] Arrival date & time: 10/04/24  9250     Patient presents with: Cough   April Houston is a 78 y.o. female.   78 year old female presenting emergency department with cough.  Ongoing since October 30.  Dry cough.  Nonproductive.  Does have seasonal allergies and GERD.  Not on ACE inhibitor.  No history of lung disease.  PCP did prescribe steroids, antibiotics and albuterol .  She notes that sometimes she is short of breath, not currently.  The albuterol  seem to help.  She recently moved into a new apartment.   Cough      Prior to Admission medications  Medication Sig Start Date End Date Taking? Authorizing Provider  ALPRAZolam  (XANAX ) 0.5 MG tablet Take 0.5 mg by mouth 4 (four) times daily.    [provider]  ALPRAZolam  (XANAX ) 1 MG tablet Take 1 tablet (1 mg total) by mouth 3 (three) times daily. Patient taking differently: Take 1 mg by mouth 3 (three) times daily as needed for anxiety. 1/2 tab 11/06/21   Gudena, Vinay, MD  betamethasone  valerate ointment (VALISONE ) 0.1 % Apply 1 Application topically daily. 04/05/24   Hines, Vera GAILS, MD  donepezil (ARICEPT) 10 MG tablet Take 10 mg by mouth daily.    [provider]  eszopiclone (LUNESTA) 2 MG TABS tablet Take 1 tab at night 11/28/21   [provider]  FLUoxetine  (PROZAC ) 10 MG capsule Take 1 capsule (10 mg total) by mouth in the morning, at noon, and at bedtime. 11/06/21   Gudena, Vinay, MD  FLUoxetine  (PROZAC ) 20 MG capsule Take 40 mg by mouth every morning.    [provider]  gabapentin  (NEURONTIN ) 100 MG capsule TAKE 1 CAPSULE (100 MG TOTAL) BY MOUTH THREE TIMES DAILY. 10/21/21   [provider]  gabapentin  (NEURONTIN ) 300 MG capsule Take 300 mg by mouth 3 (three) times daily.    [provider]  hydrOXYzine (ATARAX) 25 MG tablet Take 25 mg by mouth 3 (three) times daily. 12/03/21    [provider]  lidocaine  (XYLOCAINE ) 5 % ointment Apply topically 4 (four) times daily as needed. 04/05/24   Hines, Vera GAILS, MD  OLANZapine (ZYPREXA) 2.5 MG tablet Take 2.5 mg by mouth at bedtime.    [provider]  omeprazole  (PRILOSEC) 40 MG capsule Take 1 capsule (40 mg total) by mouth 2 (two) times daily. 11/06/21   Gudena, Vinay, MD  pantoprazole  (PROTONIX ) 40 MG tablet Oral 02/02/18   [provider]  Polyethylene Glycol 3350  (MIRALAX  PO) Take by mouth.    [provider]  predniSONE  (DELTASONE ) 5 MG tablet 12 day taper 6,6,5,5,4,4,3,3,2,2,1,1, 07/11/24   Christine Rush, DPM  Vitamin D, Ergocalciferol, (DRISDOL) 1.25 MG (50000 UNIT) CAPS capsule Take 50,000 Units by mouth once a week. 05/20/21   [provider]  ZEPBOUND 5 MG/0.5ML Pen Inject 5 mg into the skin. 03/03/24   [provider]    Allergies: Hydrocodone, Other, and Zantac [ranitidine hcl]    Review of Systems  Respiratory:  Positive for cough.     Updated Vital Signs BP (!) 146/85   Pulse 79   Temp 98.1 F (36.7 C) (Oral)   Resp 20   SpO2 100%   Physical Exam Vitals and nursing note reviewed.  Constitutional:      General: She is not in acute distress.    Appearance: She is not toxic-appearing.  HENT:  Head: Normocephalic.     Nose: Nose normal.     Mouth/Throat:     Mouth: Mucous membranes are moist.  Eyes:     Conjunctiva/sclera: Conjunctivae normal.  Cardiovascular:     Rate and Rhythm: Normal rate and regular rhythm.  Pulmonary:     Effort: Pulmonary effort is normal.     Breath sounds: Normal breath sounds. No wheezing, rhonchi or rales.  Musculoskeletal:     Right lower leg: No edema.     Left lower leg: No edema.  Skin:    General: Skin is warm.     Capillary Refill: Capillary refill takes less than 2 seconds.  Neurological:     General: No focal deficit present.     Mental Status: She is alert.  Psychiatric:        Mood and Affect: Mood  normal.        Behavior: Behavior normal.     (all labs ordered are listed, but only abnormal results are displayed) Labs Reviewed - No data to display  EKG: None  Radiology: DG Chest 2 View Result Date: 10/04/2024 CLINICAL DATA:  Cough EXAM: CHEST - 2 VIEW COMPARISON:  February 13, 2024 FINDINGS: The heart size and mediastinal contours are within normal limits. Both lungs are clear. The visualized skeletal structures are unremarkable. IMPRESSION: No active cardiopulmonary disease. Electronically Signed   By: Lynwood Landy Raddle M.D.   On: 10/04/2024 08:48     Procedures   Medications Ordered in the ED  albuterol  (VENTOLIN  HFA) 108 (90 Base) MCG/ACT inhaler 2 puff (2 puffs Inhalation Given 10/04/24 0829)    Clinical Course as of 10/04/24 0902  Wed Oct 04, 2024  0857 DG Chest 2 View IMPRESSION: No active cardiopulmonary disease.   Electronically Signed   [TY]    Clinical Course User Index [TY] Neysa Caron PARAS, DO                                 Medical Decision Making Is a 78 year old female presenting emergency department for chronic cough.  She is afebrile nontachycardic, hemodynamically stable.  Maintaining oxygen saturation on room air.  Does not appear to be in distress.  Does have a somewhat dry cough on exam.  Lungs are clear.  She was placed on antibiotics from her PCP.  Obtain chest x-ray.  No pneumonia or acute abnormality.  Was given albuterol  inhaler here.  Her symptoms have seemingly worsened since she is moved into this new apartment of hers.  I suspect there could be a reactive viral component.  Will trial another short course of steroids.  Will give Tessalon  Perles.  Stable for discharge.  Amount and/or Complexity of Data Reviewed Radiology: ordered. Decision-making details documented in ED Course.  Risk Prescription drug management.       Final diagnoses:  None    ED Discharge Orders     None          Neysa Caron PARAS, [place] 10/04/24 9096

## 2024-10-04 NOTE — ED Notes (Signed)

## 2024-10-04 NOTE — Discharge Instructions (Signed)
 Please follow-up with your primary doctor.  I have also given you the number to pulmonology that you can follow-up with.  We are prescribing you a short course of steroids that you may use.  Use the albuterol  inhaler as needed.  Will also try Tessalon  Perles to see if this helps with your cough.

## 2024-10-04 NOTE — ED Notes (Signed)
 Cannot discharge, registration in chart.

## 2024-10-23 ENCOUNTER — Ambulatory Visit: Admitting: Obstetrics and Gynecology

## 2024-11-13 ENCOUNTER — Ambulatory Visit: Admitting: Obstetrics and Gynecology

## 2024-11-15 ENCOUNTER — Ambulatory Visit: Admitting: Obstetrics and Gynecology

## 2024-11-28 ENCOUNTER — Ambulatory Visit: Admitting: Obstetrics and Gynecology
